# Patient Record
Sex: Male | Born: 1950 | Race: White | Hispanic: No | Marital: Single | Smoking: Never smoker
Health system: Southern US, Community
[De-identification: ages and names within clinical notes are randomized; demographics above are authoritative.]

## PROBLEM LIST (undated history)

## (undated) DIAGNOSIS — M1990 Unspecified osteoarthritis, unspecified site: Secondary | ICD-10-CM

## (undated) DIAGNOSIS — N200 Calculus of kidney: Secondary | ICD-10-CM

## (undated) DIAGNOSIS — E7800 Pure hypercholesterolemia, unspecified: Secondary | ICD-10-CM

## (undated) DIAGNOSIS — E119 Type 2 diabetes mellitus without complications: Secondary | ICD-10-CM

## (undated) DIAGNOSIS — I10 Essential (primary) hypertension: Secondary | ICD-10-CM

## (undated) DIAGNOSIS — K219 Gastro-esophageal reflux disease without esophagitis: Secondary | ICD-10-CM

## (undated) HISTORY — DX: Essential (primary) hypertension: I10

## (undated) HISTORY — DX: Type 2 diabetes mellitus without complications: E11.9

## (undated) HISTORY — PX: ESOPHAGOGASTRODUODENOSCOPY: SHX1529

## (undated) HISTORY — DX: Unspecified osteoarthritis, unspecified site: M19.90

## (undated) HISTORY — DX: Pure hypercholesterolemia, unspecified: E78.00

---

## 2009-01-24 HISTORY — PX: KIDNEY STONE SURGERY: SHX686

## 2010-04-14 ENCOUNTER — Emergency Department (HOSPITAL_COMMUNITY)
Admission: EM | Admit: 2010-04-14 | Discharge: 2010-04-14 | Disposition: A | Discharge status: Home / Self Care | Payer: Worker's Compensation | Attending: Emergency Medicine | Admitting: Emergency Medicine

## 2010-04-14 ENCOUNTER — Emergency Department (HOSPITAL_COMMUNITY): Payer: Worker's Compensation

## 2010-04-14 DIAGNOSIS — W240XXA Contact with lifting devices, not elsewhere classified, initial encounter: Secondary | ICD-10-CM | POA: Insufficient documentation

## 2010-04-14 DIAGNOSIS — Y9269 Other specified industrial and construction area as the place of occurrence of the external cause: Secondary | ICD-10-CM | POA: Insufficient documentation

## 2010-04-14 DIAGNOSIS — S9030XA Contusion of unspecified foot, initial encounter: Secondary | ICD-10-CM | POA: Insufficient documentation

## 2011-10-14 IMAGING — CR DG FOOT COMPLETE 3+V*R*
3 series · 3 of 3 positions shown · non-contrast
Comparison: None.

CLINICAL DATA: Trauma.  Pain and bruising over dorsum of foot.

RIGHT FOOT COMPLETE - 3+ VIEW

[view not recorded (1 of 3)]
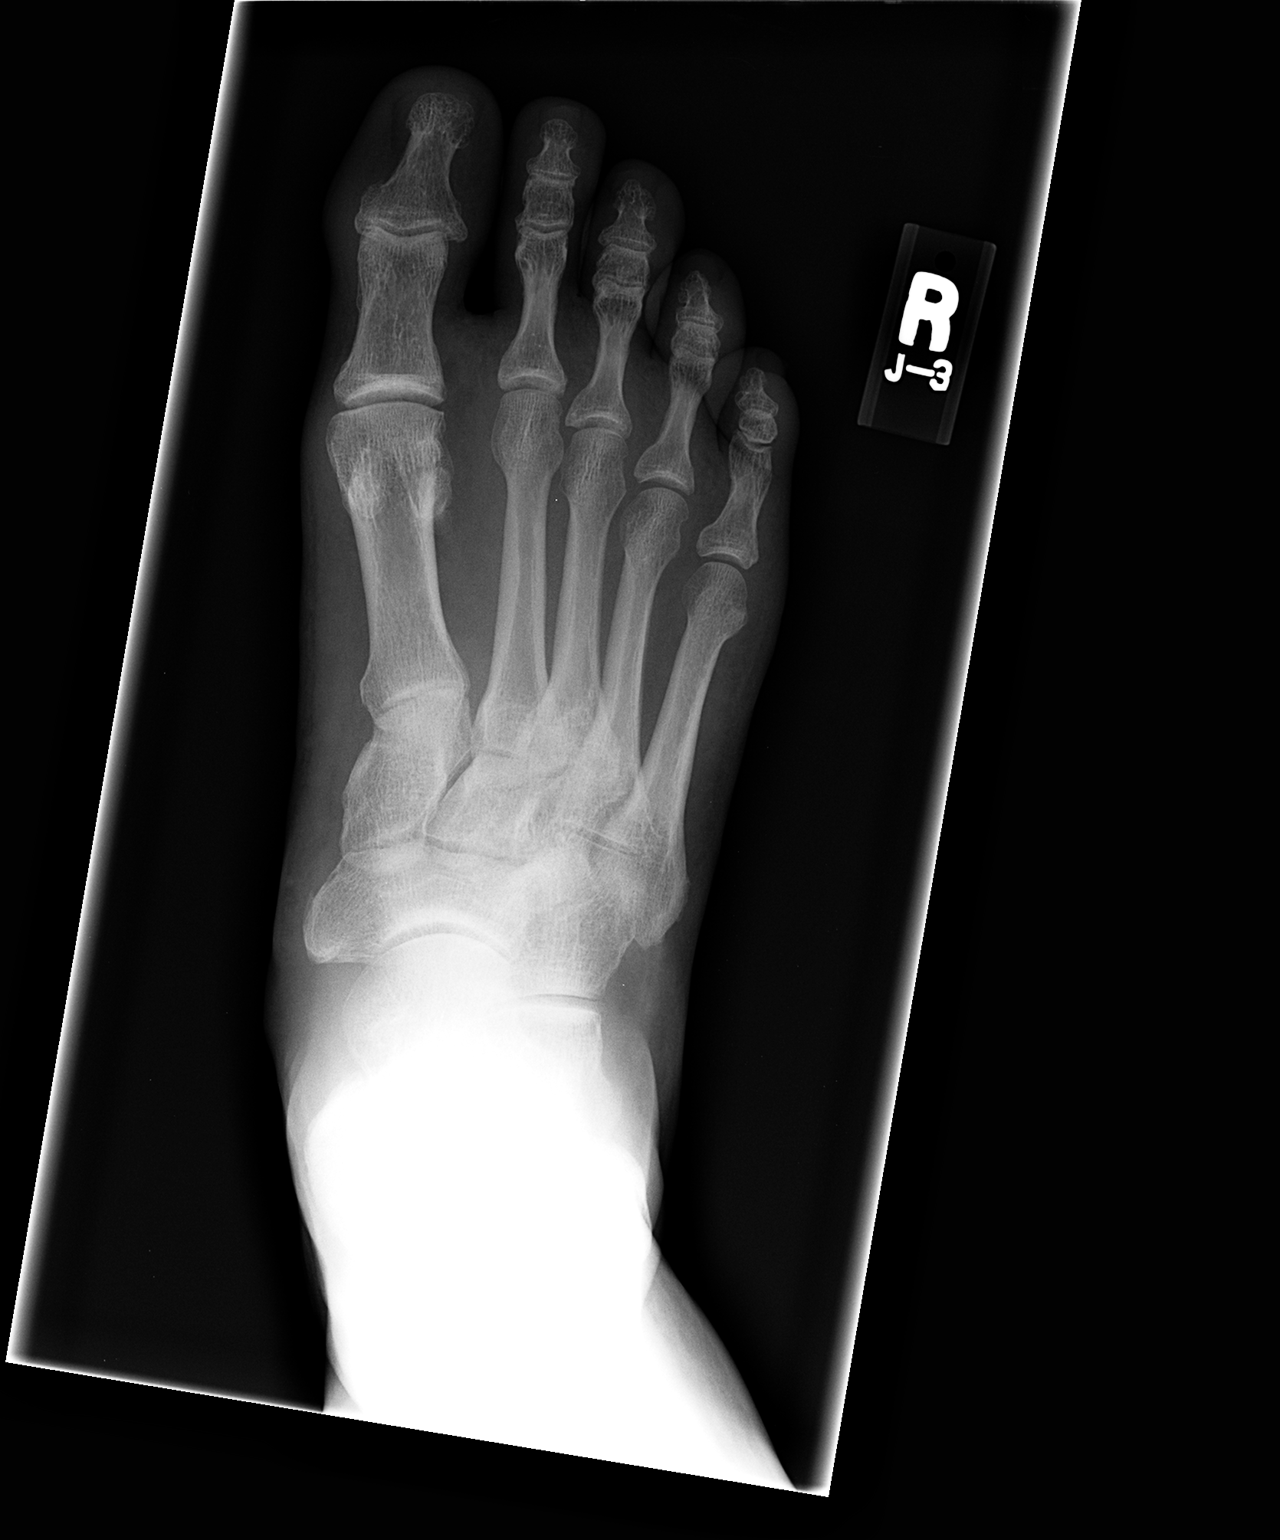

[view not recorded (2 of 3)]
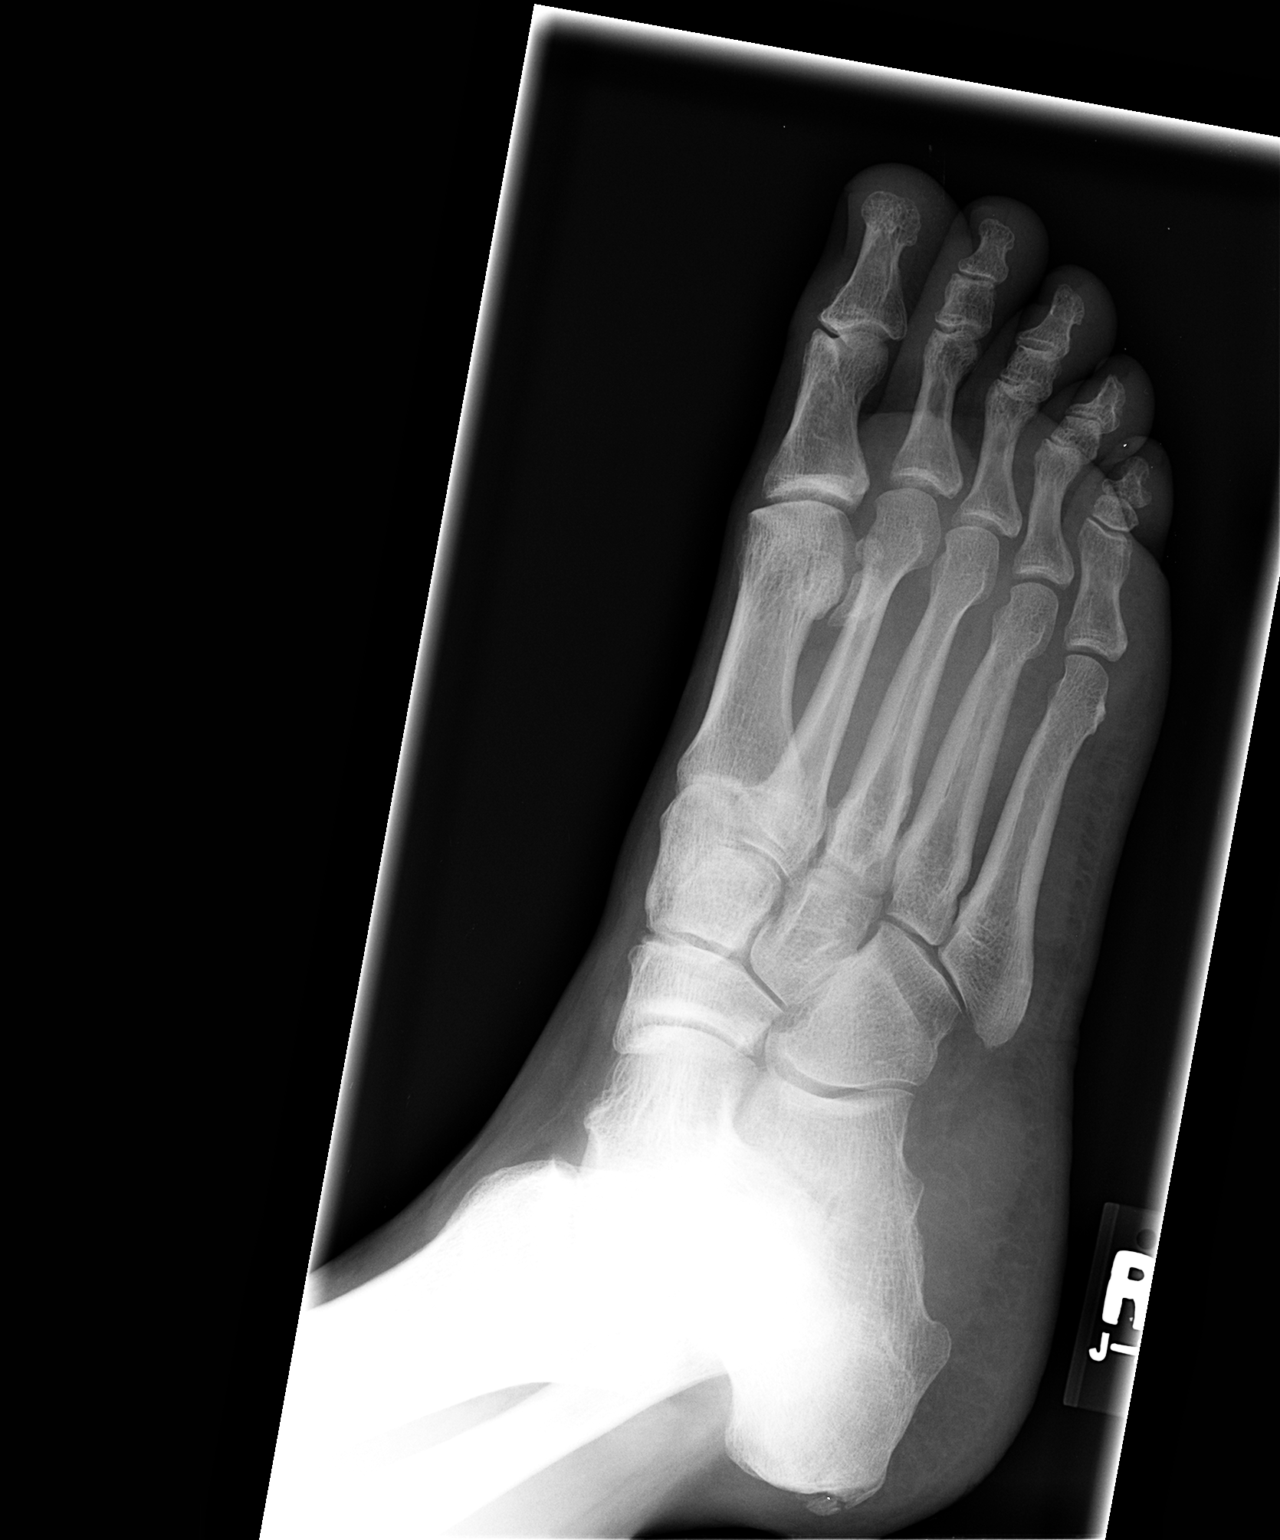

[view not recorded (3 of 3)]
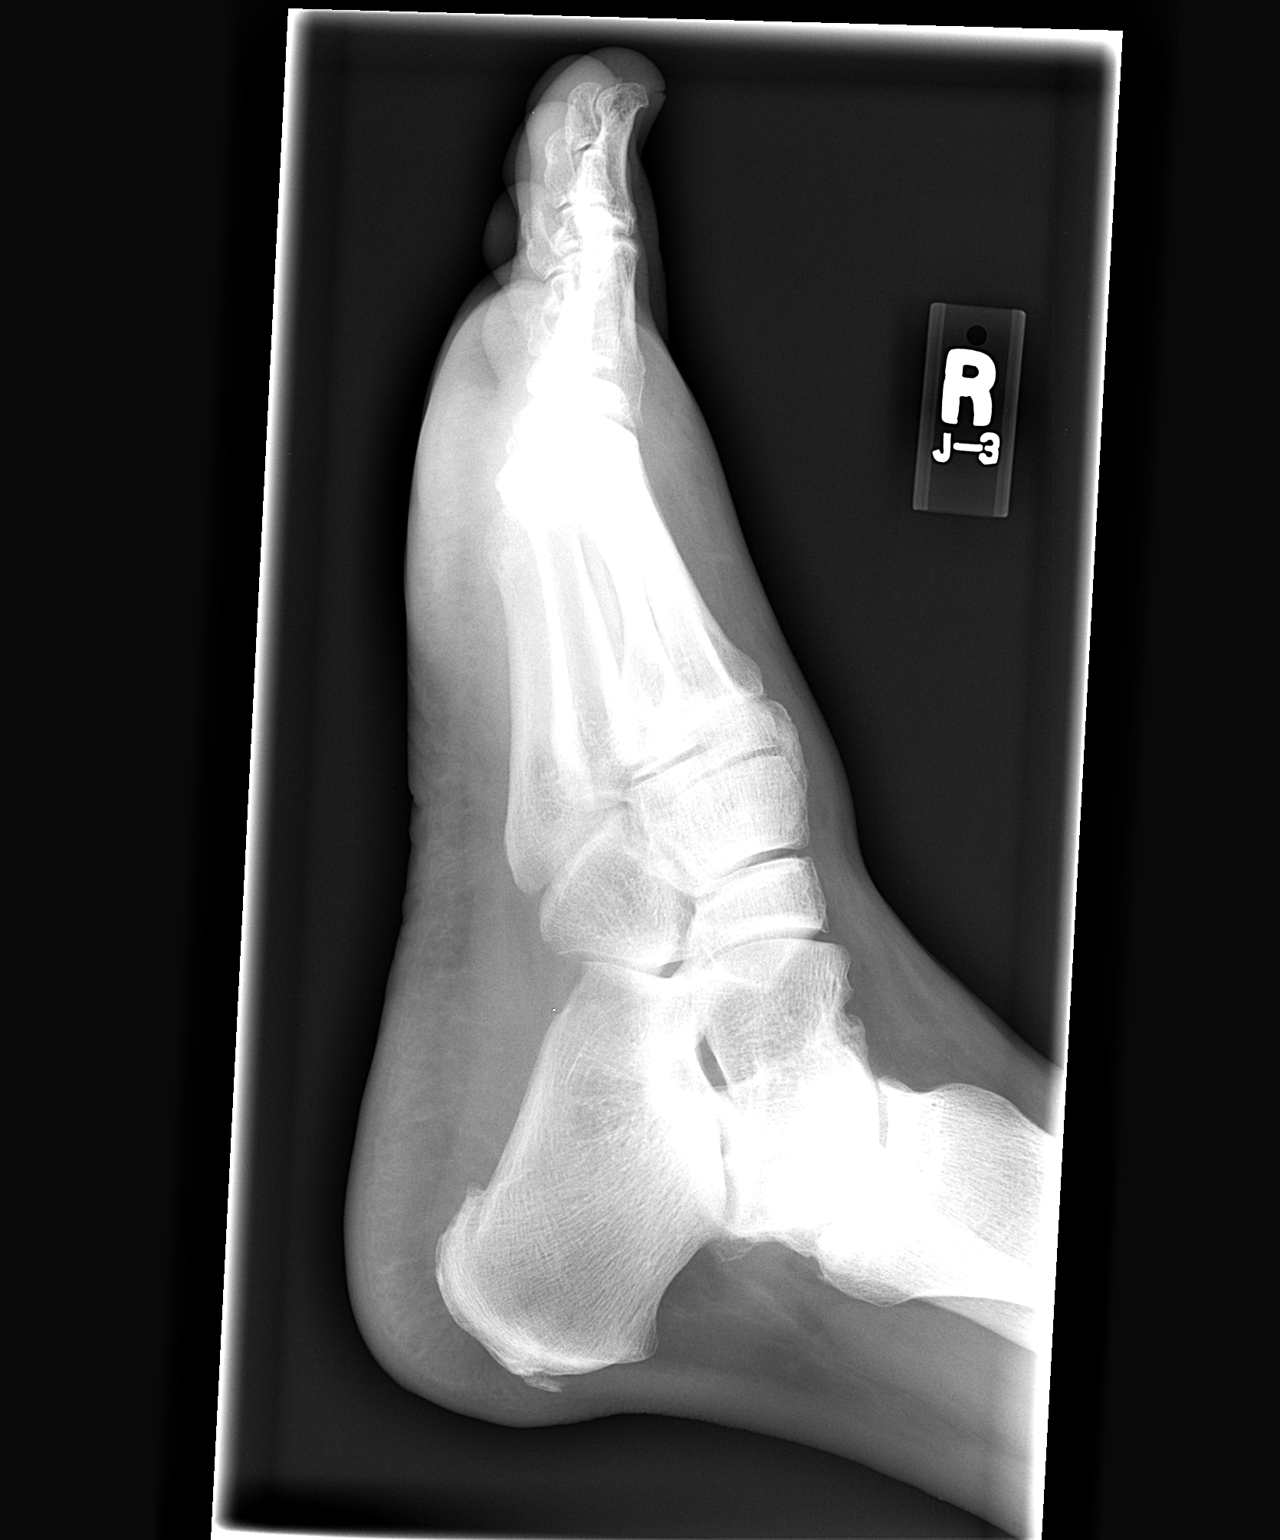

[3 of 3 positions shown; findings below may reference images not displayed]

FINDINGS: Dorsal soft tissue swelling about the forefoot.  Small
Achilles and calcaneal spurs. No acute fracture or dislocation.
IMPRESSION: Dorsal soft tissue swelling without acute osseous abnormality.

## 2013-02-13 ENCOUNTER — Encounter (INDEPENDENT_AMBULATORY_CARE_PROVIDER_SITE_OTHER): Payer: Self-pay

## 2013-02-13 ENCOUNTER — Ambulatory Visit (INDEPENDENT_AMBULATORY_CARE_PROVIDER_SITE_OTHER): Payer: No Typology Code available for payment source | Admitting: Gastroenterology

## 2013-02-13 ENCOUNTER — Encounter: Payer: Self-pay | Admitting: Gastroenterology

## 2013-02-13 VITALS — BP 119/77 | HR 60 | Temp 97.8°F | Ht 68.0 in | Wt 195.6 lb

## 2013-02-13 DIAGNOSIS — Z1211 Encounter for screening for malignant neoplasm of colon: Secondary | ICD-10-CM

## 2013-02-13 DIAGNOSIS — R1319 Other dysphagia: Secondary | ICD-10-CM

## 2013-02-13 MED ORDER — PEG 3350-KCL-NA BICARB-NACL 420 G PO SOLR: 4000.0000 mL | ORAL | Status: DC

## 2013-02-13 NOTE — Progress Notes (Signed)
Primary Care Physician:  Monico Blitz, MD Primary Gastroenterologist:  Dr. Gala Romney   Chief Complaint  Patient presents with  . Dysphagia    HPI:   Daryl Bell presents today at the request of Dr. Manuella Ghazi secondary to dysphagia. Last EGD in the remote past per patient by Dr. Gala Romney in the 1990s. States recurrent dysphagia, noting difficulty with pork chops, chicken, large pills. Feels like symptoms are worsening over time. Present for several years now. No abdominal pain. Rare constipation, doesn't feel like he needs any additional medications to treat it. No rectal bleeding. Weight loss of 40 lbs since starting Metformin. Rare indigestion; only has with beer. Takes Zantac as needed.   No prior colonoscopy.   Past Medical History  Diagnosis Date  . Diabetes   . Hypertension   . Hypercholesterolemia   . Arthritis     Past Surgical History  Procedure Laterality Date  . Esophagogastroduodenoscopy      remote past in the 1990s with dilation  . Kidney stone surgery  2011    Current Outpatient Prescriptions  Medication Sig Dispense Refill  . atenolol-chlorthalidone (TENORETIC) 100-25 MG per tablet Take 1 tablet by mouth daily.       . Cyanocobalamin (VITAMIN B-12) 500 MCG SUBL Place under the tongue daily.      Marland Kitchen loratadine (CLARITIN) 10 MG tablet Take 10 mg by mouth daily.      Marland Kitchen lovastatin (MEVACOR) 20 MG tablet Take 20 mg by mouth 2 (two) times daily.       . Magnesium 250 MG TABS Take 250 mg by mouth daily.      . metFORMIN (GLUCOPHAGE) 1000 MG tablet Take 1,000 mg by mouth 2 (two) times daily with a meal.       . potassium chloride (K-DUR) 10 MEQ tablet Take 10 mEq by mouth once.       . Potassium Gluconate 595 MG TBCR Take 595 mg by mouth daily.      . ranitidine (ZANTAC) 150 MG tablet Take 150 mg by mouth as needed for heartburn.      . traMADol (ULTRAM) 50 MG tablet Take 50 mg by mouth 2 (two) times daily.        No current facility-administered medications for this  visit.    Allergies as of 02/13/2013  . (No Known Allergies)    Family History  Problem Relation Age of Onset  . Colon cancer Neg Hx     History   Social History  . Marital Status: Single    Spouse Name: N/A    Number of Children: N/A  . Years of Education: N/A   Occupational History  . Not on file.   Social History Main Topics  . Smoking status: Never Smoker   . Smokeless tobacco: Not on file  . Alcohol Use: Yes     Comment: very very little  . Drug Use: No  . Sexual Activity: Not on file   Other Topics Concern  . Not on file   Social History Narrative  . No narrative on file    Review of Systems: Gen: Denies any fever, chills, fatigue, weight loss, lack of appetite.  CV: Denies chest pain, heart palpitations, peripheral edema, syncope.  Resp: Denies shortness of breath at rest or with exertion. Denies wheezing or cough.  GI: see HPI GU : Denies urinary burning, urinary frequency, urinary hesitancy MS: cane for walking as needed, forklift ran over right foot  Derm: Denies rash, itching,  dry skin Psych: Denies depression, anxiety, memory loss, and confusion Heme: Denies bruising, bleeding, and enlarged lymph nodes.  Physical Exam: BP 119/77  Pulse 60  Temp(Src) 97.8 F (36.6 C) (Oral)  Ht 5' 8" (1.727 m)  Wt 195 lb 9.6 oz (88.724 kg)  BMI 29.75 kg/m2 General:   Alert and oriented. Pleasant and cooperative. Well-nourished and well-developed.  Head:  Normocephalic and atraumatic. Eyes:  Without icterus, sclera clear and conjunctiva pink.  Ears:  Normal auditory acuity. Nose:  No deformity, discharge,  or lesions. Mouth:  No deformity or lesions, oral mucosa pink.  Neck:  Supple, without mass or thyromegaly. Lungs:  Clear to auscultation bilaterally. No wheezes, rales, or rhonchi. No distress.  Heart:  S1, S2 present without murmurs appreciated.  Abdomen:  +BS, soft, non-tender and non-distended. No HSM noted. No guarding or rebound. No masses  appreciated.  Rectal:  Deferred  Msk:  Symmetrical without gross deformities. Normal posture. Extremities:  Without clubbing or edema. Neurologic:  Alert and  oriented x4;  grossly normal neurologically. Skin:  Intact without significant lesions or rashes. Cervical Nodes:  No significant cervical adenopathy. Psych:  Alert and cooperative. Normal mood and affect.    

## 2013-02-13 NOTE — Assessment & Plan Note (Signed)
63 year old male with history of dysphagia in the remote past requiring dilation, now presenting with recurrent dysphagia to solids and pills. No other signs such as early satiety, odynophagia, melena. Reflux rare. Unable to retrieve past op notes as this was in the 1990s, but patient does report dilation by Dr. Gala Romney at that time.  Needs endoscopy in the near future with Dr. Gala Romney. The risks, benefits, and alternatives have been discussed in detail with patient. They have stated understanding and desire to proceed.  Phenergan 25 mg IV on call to help facilitate sedation

## 2013-02-13 NOTE — Assessment & Plan Note (Signed)
No prior lower GI evaluation. No concerning lower GI symptoms or family history of colon cancer.   Proceed with TCS at time of EGD with Dr. Gala Romney. The risks, benefits, and alternatives have been discussed with the patient in detail. The patient states understanding and desires to proceed.

## 2013-02-13 NOTE — Patient Instructions (Signed)
We have set you up for a colonoscopy, upper endoscopy and dilation with Dr. Gala Romney in near future.  Further recommendations to follow.

## 2013-02-14 NOTE — Progress Notes (Signed)
cc'd to pcp 

## 2013-03-07 ENCOUNTER — Ambulatory Visit (HOSPITAL_COMMUNITY)
Admission: RE | Admit: 2013-03-07 | Discharge: 2013-03-07 | Disposition: A | Discharge status: Home / Self Care | Payer: Medicare Other | Source: Ambulatory Visit | Attending: Internal Medicine | Admitting: Internal Medicine

## 2013-03-07 ENCOUNTER — Encounter (HOSPITAL_COMMUNITY)
Admission: RE | Disposition: A | Discharge status: Home / Self Care | Payer: Self-pay | Source: Ambulatory Visit | Attending: Internal Medicine

## 2013-03-07 ENCOUNTER — Encounter (HOSPITAL_COMMUNITY): Payer: Self-pay | Admitting: *Deleted

## 2013-03-07 DIAGNOSIS — K2100 Gastro-esophageal reflux disease with esophagitis, without bleeding: Secondary | ICD-10-CM | POA: Insufficient documentation

## 2013-03-07 DIAGNOSIS — R1310 Dysphagia, unspecified: Secondary | ICD-10-CM | POA: Insufficient documentation

## 2013-03-07 DIAGNOSIS — K222 Esophageal obstruction: Secondary | ICD-10-CM

## 2013-03-07 DIAGNOSIS — I10 Essential (primary) hypertension: Secondary | ICD-10-CM | POA: Insufficient documentation

## 2013-03-07 DIAGNOSIS — D126 Benign neoplasm of colon, unspecified: Secondary | ICD-10-CM

## 2013-03-07 DIAGNOSIS — E119 Type 2 diabetes mellitus without complications: Secondary | ICD-10-CM | POA: Insufficient documentation

## 2013-03-07 DIAGNOSIS — R1319 Other dysphagia: Secondary | ICD-10-CM

## 2013-03-07 DIAGNOSIS — Z1211 Encounter for screening for malignant neoplasm of colon: Secondary | ICD-10-CM

## 2013-03-07 DIAGNOSIS — K2940 Chronic atrophic gastritis without bleeding: Secondary | ICD-10-CM | POA: Insufficient documentation

## 2013-03-07 DIAGNOSIS — E7800 Pure hypercholesterolemia, unspecified: Secondary | ICD-10-CM | POA: Insufficient documentation

## 2013-03-07 HISTORY — PX: COLONOSCOPY, ESOPHAGOGASTRODUODENOSCOPY (EGD) AND ESOPHAGEAL DILATION: SHX5781

## 2013-03-07 HISTORY — DX: Calculus of kidney: N20.0

## 2013-03-07 HISTORY — DX: Gastro-esophageal reflux disease without esophagitis: K21.9

## 2013-03-07 LAB — GLUCOSE, CAPILLARY: Glucose-Capillary: 118 mg/dL — ABNORMAL HIGH (ref 70–99)

## 2013-03-07 SURGERY — COLONOSCOPY, ESOPHAGOGASTRODUODENOSCOPY (EGD) AND ESOPHAGEAL DILATION (ED)
Anesthesia: Moderate Sedation

## 2013-03-07 MED ORDER — PROMETHAZINE HCL 25 MG/ML IJ SOLN
25.0000 mg | Freq: Once | INTRAMUSCULAR | Status: AC
  Administered 2013-03-07: 25 mg via INTRAVENOUS

## 2013-03-07 MED ORDER — MEPERIDINE HCL 100 MG/ML IJ SOLN
INTRAMUSCULAR | Status: AC
  Filled 2013-03-07: qty 2

## 2013-03-07 MED ORDER — MIDAZOLAM HCL 5 MG/5ML IJ SOLN
INTRAMUSCULAR | Status: AC
  Filled 2013-03-07: qty 10

## 2013-03-07 MED ORDER — ONDANSETRON HCL 4 MG/2ML IJ SOLN
INTRAMUSCULAR | Status: AC
  Filled 2013-03-07: qty 2

## 2013-03-07 MED ORDER — PROMETHAZINE HCL 25 MG/ML IJ SOLN
INTRAMUSCULAR | Status: AC
  Filled 2013-03-07: qty 1

## 2013-03-07 MED ORDER — LIDOCAINE VISCOUS 2 % MT SOLN
OROMUCOSAL | Status: DC | PRN
  Administered 2013-03-07: 3 mL via OROMUCOSAL

## 2013-03-07 MED ORDER — MIDAZOLAM HCL 5 MG/5ML IJ SOLN
INTRAMUSCULAR | Status: DC | PRN
  Administered 2013-03-07 (×2): 2 mg via INTRAVENOUS
  Administered 2013-03-07 (×3): 1 mg via INTRAVENOUS

## 2013-03-07 MED ORDER — LIDOCAINE VISCOUS 2 % MT SOLN
OROMUCOSAL | Status: AC
  Filled 2013-03-07: qty 15

## 2013-03-07 MED ORDER — SIMETHICONE 40 MG/0.6ML PO SUSP
ORAL | Status: DC | PRN
  Administered 2013-03-07: 11:00:00

## 2013-03-07 MED ORDER — ONDANSETRON HCL 4 MG/2ML IJ SOLN
INTRAMUSCULAR | Status: DC | PRN
  Administered 2013-03-07: 4 mg via INTRAVENOUS

## 2013-03-07 MED ORDER — SODIUM CHLORIDE 0.9 % IV SOLN
INTRAVENOUS | Status: DC
  Administered 2013-03-07: 1000 mL via INTRAVENOUS

## 2013-03-07 MED ORDER — MEPERIDINE HCL 100 MG/ML IJ SOLN
INTRAMUSCULAR | Status: DC | PRN
  Administered 2013-03-07 (×2): 50 mg via INTRAVENOUS

## 2013-03-07 MED ORDER — SODIUM CHLORIDE 0.9 % IJ SOLN
INTRAMUSCULAR | Status: AC
  Filled 2013-03-07: qty 10

## 2013-03-07 NOTE — Interval H&P Note (Signed)
History and Physical Interval Note:  03/07/2013 10:59 AM  Daryl Bell  has presented today for surgery, with the diagnosis of SCREENING COLONOSCOPY AND DYSPHAGIA  The various methods of treatment have been discussed with the patient and family. After consideration of risks, benefits and other options for treatment, the patient has consented to  Procedure(s) with comments: COLONOSCOPY, ESOPHAGOGASTRODUODENOSCOPY (EGD) AND ESOPHAGEAL DILATION (N/A) - 11:15 as a surgical intervention .  The patient's history has been reviewed, patient examined, no change in status, stable for surgery.  I have reviewed the patient's chart and labs.  Questions were answered to the patient's satisfaction.     Daryl Bell  No change. EGD with probable esophageal dilation and colonoscopy per plan.The risks, benefits, limitations, imponderables and alternatives regarding both EGD and colonoscopy have been reviewed with the patient. Questions have been answered. All parties agreeable.

## 2013-03-07 NOTE — Discharge Instructions (Addendum)
Colonoscopy Discharge Instructions  Read the instructions outlined below and refer to this sheet in the next few weeks. These discharge instructions provide you with general information on caring for yourself after you leave the hospital. Your doctor may also give you specific instructions. While your treatment has been planned according to the most current medical practices available, unavoidable complications occasionally occur. If you have any problems or questions after discharge, call Dr. Gala Romney at [phone]. ACTIVITY  You may resume your regular activity, but move at a slower pace for the next 24 hours.   Take frequent rest periods for the next 24 hours.   Walking will help get rid of the air and reduce the bloated feeling in your belly (abdomen).   No driving for 24 hours (because of the medicine (anesthesia) used during the test).    Do not sign any important legal documents or operate any machinery for 24 hours (because of the anesthesia used during the test).  NUTRITION  Drink plenty of fluids.   You may resume your normal diet as instructed by your doctor.   Begin with a light meal and progress to your normal diet. Heavy or fried foods are harder to digest and may make you feel sick to your stomach (nauseated).   Avoid alcoholic beverages for 24 hours or as instructed.  MEDICATIONS  You may resume your normal medications unless your doctor tells you otherwise.  WHAT YOU CAN EXPECT TODAY  Some feelings of bloating in the abdomen.   Passage of more gas than usual.   Spotting of blood in your stool or on the toilet paper.  IF YOU HAD POLYPS REMOVED DURING THE COLONOSCOPY:  No aspirin products for 7 days or as instructed.   No alcohol for 7 days or as instructed.   Eat a soft diet for the next 24 hours.  FINDING OUT THE RESULTS OF YOUR TEST Not all test results are available during your visit. If your test results are not back during the visit, make an appointment  with your caregiver to find out the results. Do not assume everything is normal if you have not heard from your caregiver or the medical facility. It is important for you to follow up on all of your test results.  SEEK IMMEDIATE MEDICAL ATTENTION IF:  You have more than a spotting of blood in your stool.   Your belly is swollen (abdominal distention).   You are nauseated or vomiting.   You have a temperature over 101.   You have abdominal pain or discomfort that is severe or gets worse throughout the day.   EGD Discharge instructions Please read the instructions outlined below and refer to this sheet in the next few weeks. These discharge instructions provide you with general information on caring for yourself after you leave the hospital. Your doctor may also give you specific instructions. While your treatment has been planned according to the most current medical practices available, unavoidable complications occasionally occur. If you have any problems or questions after discharge, please call your doctor. ACTIVITY  You may resume your regular activity but move at a slower pace for the next 24 hours.   Take frequent rest periods for the next 24 hours.   Walking will help expel (get rid of) the air and reduce the bloated feeling in your abdomen.   No driving for 24 hours (because of the anesthesia (medicine) used during the test).   You may shower.   Do not sign any  important legal documents or operate any machinery for 24 hours (because of the anesthesia used during the test).  NUTRITION  Drink plenty of fluids.   You may resume your normal diet.   Begin with a light meal and progress to your normal diet.   Avoid alcoholic beverages for 24 hours or as instructed by your caregiver.  MEDICATIONS  You may resume your normal medications unless your caregiver tells you otherwise.  WHAT YOU CAN EXPECT TODAY  You may experience abdominal discomfort such as a feeling of  fullness or gas pains.  FOLLOW-UP  Your doctor will discuss the results of your test with you.  SEEK IMMEDIATE MEDICAL ATTENTION IF ANY OF THE FOLLOWING OCCUR:  Excessive nausea (feeling sick to your stomach) and/or vomiting.   Severe abdominal pain and distention (swelling).   Trouble swallowing.   Temperature over 101 F (37.8 C).   Rectal bleeding or vomiting of blood.     Stop ranitidine; begin Protonix 40 mg daily  GERD information provided  Polyp information provided  Further recommendations to follow pending review of pathology report   Colon Polyps Polyps are lumps of extra tissue growing inside the body. Polyps can grow in the large intestine (colon). Most colon polyps are noncancerous (benign). However, some colon polyps can become cancerous over time. Polyps that are larger than a pea may be harmful. To be safe, caregivers remove and test all polyps. CAUSES  Polyps form when mutations in the genes cause your cells to grow and divide even though no more tissue is needed. RISK FACTORS There are a number of risk factors that can increase your chances of getting colon polyps. They include:  Being older than 50 years.  Family history of colon polyps or colon cancer.  Long-term colon diseases, such as colitis or Crohn disease.  Being overweight.  Smoking.  Being inactive.  Drinking too much alcohol. SYMPTOMS  Most small polyps do not cause symptoms. If symptoms are present, they may include:  Blood in the stool. The stool may look dark red or black.  Constipation or diarrhea that lasts longer than 1 week. DIAGNOSIS People often do not know they have polyps until their caregiver finds them during a regular checkup. Your caregiver can use 4 tests to check for polyps:  Digital rectal exam. The caregiver wears gloves and feels inside the rectum. This test would find polyps only in the rectum.  Barium enema. The caregiver puts a liquid called barium into  your rectum before taking X-rays of your colon. Barium makes your colon look white. Polyps are dark, so they are easy to see in the X-ray pictures.  Sigmoidoscopy. A thin, flexible tube (sigmoidoscope) is placed into your rectum. The sigmoidoscope has a light and tiny camera in it. The caregiver uses the sigmoidoscope to look at the last third of your colon.  Colonoscopy. This test is like sigmoidoscopy, but the caregiver looks at the entire colon. This is the most common method for finding and removing polyps. TREATMENT  Any polyps will be removed during a sigmoidoscopy or colonoscopy. The polyps are then tested for cancer. PREVENTION  To help lower your risk of getting more colon polyps:  Eat plenty of fruits and vegetables. Avoid eating fatty foods.  Do not smoke.  Avoid drinking alcohol.  Exercise every day.  Lose weight if recommended by your caregiver.  Eat plenty of calcium and folate. Foods that are rich in calcium include milk, cheese, and broccoli. Foods that are rich  in folate include chickpeas, kidney beans, and spinach. HOME CARE INSTRUCTIONS Keep all follow-up appointments as directed by your caregiver. You may need periodic exams to check for polyps. SEEK MEDICAL CARE IF: You notice bleeding during a bowel movement. Document Released: 10/07/2003 Document Revised: 04/04/2011 Document Reviewed: 03/22/2011 [facility] Patient Information 2014 [place]. Gastroesophageal Reflux Disease, Adult Gastroesophageal reflux disease (GERD) happens when acid from your stomach goes into your food pipe (esophagus). The acid can cause a burning feeling in your chest. Over time, the acid can make small holes (ulcers) in your food pipe.  HOME CARE  Ask your doctor for advice about:  Losing weight.  Quitting smoking.  Alcohol use.  Avoid foods and drinks that make your problems worse. You may want to avoid:  Caffeine and alcohol.  Chocolate.  Mints.  Garlic and  onions.  Spicy foods.  Citrus fruits, such as oranges, lemons, or limes.  Foods that contain tomato, such as sauce, chili, salsa, and pizza.  Fried and fatty foods.  Avoid lying down for 3 hours before you go to bed or before you take a nap.  Eat small meals often, instead of large meals.  Wear loose-fitting clothing. Do not wear anything tight around your waist.  Raise (elevate) the head of your bed 6 to 8 inches with wood blocks. Using extra pillows does not help.  Only take medicines as told by your doctor.  Do not take aspirin or ibuprofen. GET HELP RIGHT AWAY IF:   You have pain in your arms, neck, jaw, teeth, or back.  Your pain gets worse or changes.  You feel sick to your stomach (nauseous), throw up (vomit), or sweat (diaphoresis).  You feel short of breath, or you pass out (faint).  Your throw up is green, yellow, black, or looks like coffee grounds or blood.  Your poop (stool) is red, bloody, or black. MAKE SURE YOU:   Understand these instructions.  Will watch your condition.  Will get help right away if you are not doing well or get worse. Document Released: 06/29/2007 Document Revised: 04/04/2011 Document Reviewed: 07/30/2010 [facility] Patient Information 2014 [place], [place]. Diet for Gastroesophageal Reflux Disease, Adult Reflux is when stomach acid flows up into the esophagus. The esophagus becomes irritated and sore (inflammation). When reflux happens often and is severe, it is called gastroesophageal reflux disease (GERD). What you eat can help ease any discomfort caused by GERD. FOODS OR DRINKS TO AVOID OR LIMIT  Coffee and black tea, with or without caffeine.  Bubbly (carbonated) drinks with caffeine or energy drinks.  Strong spices, such as pepper, cayenne pepper, curry, or chili powder.  Peppermint or spearmint.  Chocolate.  High-fat foods, such as meats, fried food, oils, butter, or nuts.  Fruits and vegetables that cause discomfort.  This includes citrus fruits and tomatoes.  Alcohol. If a certain food or drink irritates your GERD, avoid eating or drinking it. THINGS THAT MAY HELP GERD INCLUDE:  Eat meals slowly.  Eat 5 to 6 small meals a day, not 3 large meals.  Do not eat food for a certain amount of time if it causes discomfort.  Wait 3 hours after eating before lying down.  Keep the head of your bed raised 6 to 9 inches (15 23 centimeters). Put a foam wedge or blocks under the legs of the bed.  Stay active. Weight loss, if needed, may help ease your discomfort.  Wear loose-fitting clothing.  Do not smoke or chew tobacco. Document Released: 07/12/2011  Document Reviewed: 07/12/2011 [facility] Patient Information 2014 [place].

## 2013-03-07 NOTE — Op Note (Signed)
[facility] [address] [place], [address]   COLONOSCOPY PROCEDURE REPORT  PATIENT: [name], [name]  MR#:         [id number] BIRTHDATE: 14-Feb-1950 , 74  yrs. old GENDER: Male ENDOSCOPIST: R.  Garfield Cornea, MD FACP FACG REFERRED BY:  Monico Blitz, M.D. PROCEDURE DATE:  03/07/2013 PROCEDURE:     Ileocolonoscopy with multiple snare polypectomies  INDICATIONS: First-ever average risk colorectal future screening examination  INFORMED CONSENT:  The risks, benefits, alternatives and imponderables including but not limited to bleeding, perforation as well as the possibility of a missed lesion have been reviewed.  The potential for biopsy, lesion removal, etc. have also been discussed.  Questions have been answered.  All parties agreeable. Please see the history and physical in the medical record for more information.  MEDICATIONS: Versed 7 mg 100 and Demerol 100 milligrams IV and Zofran 4 mg IV.  Phenergan 25 mg IV   DESCRIPTION OF PROCEDURE:  After a digital rectal exam was performed, the EC-[id number] ([id number])  colonoscope was advanced from the anus through the rectum and colon to the area of the cecum, ileocecal valve and appendiceal orifice.  The cecum was deeply intubated.  These structures were well-seen and photographed for the record.  From the level of the cecum and ileocecal valve, the scope was slowly and cautiously withdrawn.  The mucosal surfaces were carefully surveyed utilizing scope tip deflection to facilitate fold flattening as needed.  The scope was pulled down into the rectum where a thorough examination including retroflexion was performed.    FINDINGS:  Adequate preparation. Normal rectum. (3) pedunculated polyps at the hepatic flexure-the largest approximately 9 mm in dimensions. Otherwise, the remainder of the colonic mucosa appeared normal. The distal 5 cm of terminal ileal mucosa appeared normal.  THERAPEUTIC / DIAGNOSTIC MANEUVERS  PERFORMED:  The above-mentioned polyps were hot snare removed.  COMPLICATIONS: none CECAL WITHDRAWAL TIME:  11 minutes  IMPRESSION:  Multiple colonic polyps-removed as described above  RECOMMENDATIONS: Followup on pathology. See EGD report.   _______________________________ eSigned:  R. Garfield Cornea, MD FACP [facility] 03/07/2013 11:56 AM   CC:    PATIENT NAME:  Daryl Bell, Daryl Bell MR#: [id number]

## 2013-03-07 NOTE — Op Note (Signed)
[facility] [address] [place], [address]   ENDOSCOPY PROCEDURE REPORT  PATIENT: Daryl, Bell  MR#: [id number] BIRTHDATE: 02/14/1950 , 51  yrs. old GENDER: Male ENDOSCOPIST: R.  Garfield Cornea, MD FACP FACG REFERRED BY:  Monico Blitz, M.D. PROCEDURE DATE:  03/07/2013 PROCEDURE:     EGD with Venia Minks dilation followed by duodenal, gastric and esophageal biopsy  INDICATIONS:     Recurrent esophageal dysphagia  INFORMED CONSENT:   The risks, benefits, limitations, alternatives and imponderables have been discussed.  The potential for biopsy, esophogeal dilation, etc. have also been reviewed.  Questions have been answered.  All parties agreeable.  Please see the history and physical in the medical record for more information.  MEDICATIONS:      Versed 5 mg IV and Demerol 100 mg IV in divided doses; Phenergan 25 mg and Zofran 4 mg. Xylocaine gel 2% topically.  DESCRIPTION OF PROCEDURE:   The EG-[id number] ([id number])  endoscope was introduced through the mouth and advanced to the second portion of the duodenum without difficulty or limitations.  The mucosal surfaces were surveyed very carefully during advancement of the scope and upon withdrawal.  Retroflexion view of the proximal stomach and esophagogastric junction was performed.      FINDINGS: Schatzki's ring present with overlying erosions. No evidence of Barrett's esophagus. Stomach empty. Small hiatal hernia. Antral erosions and patchy erythema. No ulcer or infiltrating process. Patent pylorus. Examination of the bulb second and third portion multiple erosions involving the second third portion of the duodenum.  THERAPEUTIC / DIAGNOSTIC MANEUVERS PERFORMED:  A 56 French Maloney dilator was passed with full insertion with mild to moderate resistance. A look back revealed a Schatzki's ring it did rupture nicely there were multiple superficial tears along the esophageal wall. These were all superficial  with minimal bleeding. Subsequently, biopsies duodenum, stomach and esophagus were taken for histologic study   COMPLICATIONS:  None  IMPRESSION:  Erosive reflux esophagitis. Schatzki's ring  -  status post dilation. Status post esophageal biopsy. Gastric and duodenal erosions status post biopsy  RECOMMENDATIONS:  Stop ranitidine; begin Protonix  40 mg daily. Followup on pathology. See colonoscopy report.    _______________________________ R. Garfield Cornea, MD FACP [facility] eSigned:  R. Garfield Cornea, MD FACP [facility] 03/07/2013 11:31 AM     CC:  PATIENT NAME:  Daryl, Bell MR#: [id number]

## 2013-03-07 NOTE — H&P (View-Only) (Signed)
Primary Care Physician:  Monico Blitz, MD Primary Gastroenterologist:  Dr. Gala Romney   Chief Complaint  Patient presents with  . Dysphagia    HPI:   Daryl Bell presents today at the request of Dr. Manuella Ghazi secondary to dysphagia. Last EGD in the remote past per patient by Dr. Gala Romney in the 1990s. States recurrent dysphagia, noting difficulty with pork chops, chicken, large pills. Feels like symptoms are worsening over time. Present for several years now. No abdominal pain. Rare constipation, doesn't feel like he needs any additional medications to treat it. No rectal bleeding. Weight loss of 40 lbs since starting Metformin. Rare indigestion; only has with beer. Takes Zantac as needed.   No prior colonoscopy.   Past Medical History  Diagnosis Date  . Diabetes   . Hypertension   . Hypercholesterolemia   . Arthritis     Past Surgical History  Procedure Laterality Date  . Esophagogastroduodenoscopy      remote past in the 1990s with dilation  . Kidney stone surgery  2011    Current Outpatient Prescriptions  Medication Sig Dispense Refill  . atenolol-chlorthalidone (TENORETIC) 100-25 MG per tablet Take 1 tablet by mouth daily.       . Cyanocobalamin (VITAMIN B-12) 500 MCG SUBL Place under the tongue daily.      Marland Kitchen loratadine (CLARITIN) 10 MG tablet Take 10 mg by mouth daily.      Marland Kitchen lovastatin (MEVACOR) 20 MG tablet Take 20 mg by mouth 2 (two) times daily.       . Magnesium 250 MG TABS Take 250 mg by mouth daily.      . metFORMIN (GLUCOPHAGE) 1000 MG tablet Take 1,000 mg by mouth 2 (two) times daily with a meal.       . potassium chloride (K-DUR) 10 MEQ tablet Take 10 mEq by mouth once.       . Potassium Gluconate 595 MG TBCR Take 595 mg by mouth daily.      . ranitidine (ZANTAC) 150 MG tablet Take 150 mg by mouth as needed for heartburn.      . traMADol (ULTRAM) 50 MG tablet Take 50 mg by mouth 2 (two) times daily.        No current facility-administered medications for this  visit.    Allergies as of 02/13/2013  . (No Known Allergies)    Family History  Problem Relation Age of Onset  . Colon cancer Neg Hx     History   Social History  . Marital Status: Single    Spouse Name: N/A    Number of Children: N/A  . Years of Education: N/A   Occupational History  . Not on file.   Social History Main Topics  . Smoking status: Never Smoker   . Smokeless tobacco: Not on file  . Alcohol Use: Yes     Comment: very very little  . Drug Use: No  . Sexual Activity: Not on file   Other Topics Concern  . Not on file   Social History Narrative  . No narrative on file    Review of Systems: Gen: Denies any fever, chills, fatigue, weight loss, lack of appetite.  CV: Denies chest pain, heart palpitations, peripheral edema, syncope.  Resp: Denies shortness of breath at rest or with exertion. Denies wheezing or cough.  GI: see HPI GU : Denies urinary burning, urinary frequency, urinary hesitancy MS: cane for walking as needed, forklift ran over right foot  Derm: Denies rash, itching,  dry skin Psych: Denies depression, anxiety, memory loss, and confusion Heme: Denies bruising, bleeding, and enlarged lymph nodes.  Physical Exam: BP 119/77  Pulse 60  Temp(Src) 97.8 F (36.6 C) (Oral)  Ht 5\' 8"  (1.727 m)  Wt 195 lb 9.6 oz (88.724 kg)  BMI 29.75 kg/m2 General:   Alert and oriented. Pleasant and cooperative. Well-nourished and well-developed.  Head:  Normocephalic and atraumatic. Eyes:  Without icterus, sclera clear and conjunctiva pink.  Ears:  Normal auditory acuity. Nose:  No deformity, discharge,  or lesions. Mouth:  No deformity or lesions, oral mucosa pink.  Neck:  Supple, without mass or thyromegaly. Lungs:  Clear to auscultation bilaterally. No wheezes, rales, or rhonchi. No distress.  Heart:  S1, S2 present without murmurs appreciated.  Abdomen:  +BS, soft, non-tender and non-distended. No HSM noted. No guarding or rebound. No masses  appreciated.  Rectal:  Deferred  Msk:  Symmetrical without gross deformities. Normal posture. Extremities:  Without clubbing or edema. Neurologic:  Alert and  oriented x4;  grossly normal neurologically. Skin:  Intact without significant lesions or rashes. Cervical Nodes:  No significant cervical adenopathy. Psych:  Alert and cooperative. Normal mood and affect.

## 2013-03-08 ENCOUNTER — Encounter: Payer: Self-pay | Admitting: Internal Medicine

## 2013-03-12 ENCOUNTER — Encounter (HOSPITAL_COMMUNITY): Payer: Self-pay | Admitting: Internal Medicine

## 2013-03-19 ENCOUNTER — Telehealth: Payer: Self-pay | Admitting: Internal Medicine

## 2013-03-19 NOTE — Telephone Encounter (Signed)
Pt received his letter regarding his results but has questions that he would like to ask the nurse. Please call him back at [phone]

## 2013-03-19 NOTE — Telephone Encounter (Signed)
I called patient and he understand the letters now

## 2013-06-10 ENCOUNTER — Encounter: Payer: Self-pay | Admitting: Internal Medicine

## 2015-03-05 DIAGNOSIS — I10 Essential (primary) hypertension: Secondary | ICD-10-CM | POA: Diagnosis not present

## 2015-03-05 DIAGNOSIS — M2550 Pain in unspecified joint: Secondary | ICD-10-CM | POA: Diagnosis not present

## 2015-04-09 DIAGNOSIS — E1165 Type 2 diabetes mellitus with hyperglycemia: Secondary | ICD-10-CM | POA: Diagnosis not present

## 2015-04-09 DIAGNOSIS — J209 Acute bronchitis, unspecified: Secondary | ICD-10-CM | POA: Diagnosis not present

## 2015-04-09 DIAGNOSIS — E119 Type 2 diabetes mellitus without complications: Secondary | ICD-10-CM | POA: Diagnosis not present

## 2015-04-09 DIAGNOSIS — Z6835 Body mass index (BMI) 35.0-35.9, adult: Secondary | ICD-10-CM | POA: Diagnosis not present

## 2015-04-09 DIAGNOSIS — I10 Essential (primary) hypertension: Secondary | ICD-10-CM | POA: Diagnosis not present

## 2015-04-09 DIAGNOSIS — Z789 Other specified health status: Secondary | ICD-10-CM | POA: Diagnosis not present

## 2015-04-09 DIAGNOSIS — M159 Polyosteoarthritis, unspecified: Secondary | ICD-10-CM | POA: Diagnosis not present

## 2015-05-19 DIAGNOSIS — I10 Essential (primary) hypertension: Secondary | ICD-10-CM | POA: Diagnosis not present

## 2015-05-19 DIAGNOSIS — M159 Polyosteoarthritis, unspecified: Secondary | ICD-10-CM | POA: Diagnosis not present

## 2015-05-19 DIAGNOSIS — E119 Type 2 diabetes mellitus without complications: Secondary | ICD-10-CM | POA: Diagnosis not present

## 2015-06-01 DIAGNOSIS — E1165 Type 2 diabetes mellitus with hyperglycemia: Secondary | ICD-10-CM | POA: Diagnosis not present

## 2015-06-01 DIAGNOSIS — M1990 Unspecified osteoarthritis, unspecified site: Secondary | ICD-10-CM | POA: Diagnosis not present

## 2015-06-01 DIAGNOSIS — I10 Essential (primary) hypertension: Secondary | ICD-10-CM | POA: Diagnosis not present

## 2015-06-01 DIAGNOSIS — G8929 Other chronic pain: Secondary | ICD-10-CM | POA: Diagnosis not present

## 2015-06-04 DIAGNOSIS — M19042 Primary osteoarthritis, left hand: Secondary | ICD-10-CM | POA: Diagnosis not present

## 2015-06-04 DIAGNOSIS — M19072 Primary osteoarthritis, left ankle and foot: Secondary | ICD-10-CM | POA: Diagnosis not present

## 2015-06-04 DIAGNOSIS — M19071 Primary osteoarthritis, right ankle and foot: Secondary | ICD-10-CM | POA: Diagnosis not present

## 2015-06-04 DIAGNOSIS — M19041 Primary osteoarthritis, right hand: Secondary | ICD-10-CM | POA: Diagnosis not present

## 2015-06-17 DIAGNOSIS — M1990 Unspecified osteoarthritis, unspecified site: Secondary | ICD-10-CM | POA: Diagnosis not present

## 2015-07-16 DIAGNOSIS — E1165 Type 2 diabetes mellitus with hyperglycemia: Secondary | ICD-10-CM | POA: Diagnosis not present

## 2015-07-16 DIAGNOSIS — I10 Essential (primary) hypertension: Secondary | ICD-10-CM | POA: Diagnosis not present

## 2015-07-16 DIAGNOSIS — Z299 Encounter for prophylactic measures, unspecified: Secondary | ICD-10-CM | POA: Diagnosis not present

## 2015-07-23 DIAGNOSIS — M159 Polyosteoarthritis, unspecified: Secondary | ICD-10-CM | POA: Diagnosis not present

## 2015-07-23 DIAGNOSIS — I10 Essential (primary) hypertension: Secondary | ICD-10-CM | POA: Diagnosis not present

## 2015-07-23 DIAGNOSIS — E119 Type 2 diabetes mellitus without complications: Secondary | ICD-10-CM | POA: Diagnosis not present

## 2015-07-30 DIAGNOSIS — Z299 Encounter for prophylactic measures, unspecified: Secondary | ICD-10-CM | POA: Diagnosis not present

## 2015-07-30 DIAGNOSIS — R5383 Other fatigue: Secondary | ICD-10-CM | POA: Diagnosis not present

## 2015-08-27 DIAGNOSIS — R5381 Other malaise: Secondary | ICD-10-CM | POA: Diagnosis not present

## 2015-08-27 DIAGNOSIS — M542 Cervicalgia: Secondary | ICD-10-CM | POA: Diagnosis not present

## 2015-08-27 DIAGNOSIS — L568 Other specified acute skin changes due to ultraviolet radiation: Secondary | ICD-10-CM | POA: Diagnosis not present

## 2015-08-27 DIAGNOSIS — R760 Raised antibody titer: Secondary | ICD-10-CM | POA: Diagnosis not present

## 2015-08-27 DIAGNOSIS — Z79899 Other long term (current) drug therapy: Secondary | ICD-10-CM | POA: Diagnosis not present

## 2015-08-27 DIAGNOSIS — M2550 Pain in unspecified joint: Secondary | ICD-10-CM | POA: Diagnosis not present

## 2015-08-27 DIAGNOSIS — R5383 Other fatigue: Secondary | ICD-10-CM | POA: Diagnosis not present

## 2015-08-27 DIAGNOSIS — R768 Other specified abnormal immunological findings in serum: Secondary | ICD-10-CM | POA: Diagnosis not present

## 2015-08-27 DIAGNOSIS — M545 Low back pain: Secondary | ICD-10-CM | POA: Diagnosis not present

## 2015-08-27 DIAGNOSIS — M170 Bilateral primary osteoarthritis of knee: Secondary | ICD-10-CM | POA: Diagnosis not present

## 2015-09-11 DIAGNOSIS — M159 Polyosteoarthritis, unspecified: Secondary | ICD-10-CM | POA: Diagnosis not present

## 2015-09-11 DIAGNOSIS — I10 Essential (primary) hypertension: Secondary | ICD-10-CM | POA: Diagnosis not present

## 2015-09-11 DIAGNOSIS — E119 Type 2 diabetes mellitus without complications: Secondary | ICD-10-CM | POA: Diagnosis not present

## 2015-09-17 DIAGNOSIS — Z299 Encounter for prophylactic measures, unspecified: Secondary | ICD-10-CM | POA: Diagnosis not present

## 2015-09-17 DIAGNOSIS — Z0000 Encounter for general adult medical examination without abnormal findings: Secondary | ICD-10-CM | POA: Diagnosis not present

## 2015-09-17 DIAGNOSIS — Z1389 Encounter for screening for other disorder: Secondary | ICD-10-CM | POA: Diagnosis not present

## 2015-09-17 DIAGNOSIS — Z1211 Encounter for screening for malignant neoplasm of colon: Secondary | ICD-10-CM | POA: Diagnosis not present

## 2015-09-17 DIAGNOSIS — E7800 Pure hypercholesterolemia, unspecified: Secondary | ICD-10-CM | POA: Diagnosis not present

## 2015-09-17 DIAGNOSIS — Z7189 Other specified counseling: Secondary | ICD-10-CM | POA: Diagnosis not present

## 2015-10-01 DIAGNOSIS — M79671 Pain in right foot: Secondary | ICD-10-CM | POA: Diagnosis not present

## 2015-10-01 DIAGNOSIS — Z23 Encounter for immunization: Secondary | ICD-10-CM | POA: Diagnosis not present

## 2015-10-01 DIAGNOSIS — Z09 Encounter for follow-up examination after completed treatment for conditions other than malignant neoplasm: Secondary | ICD-10-CM | POA: Diagnosis not present

## 2015-10-01 DIAGNOSIS — M79641 Pain in right hand: Secondary | ICD-10-CM | POA: Diagnosis not present

## 2015-10-01 DIAGNOSIS — M25561 Pain in right knee: Secondary | ICD-10-CM | POA: Diagnosis not present

## 2015-10-08 DIAGNOSIS — Z6834 Body mass index (BMI) 34.0-34.9, adult: Secondary | ICD-10-CM | POA: Diagnosis not present

## 2015-10-08 DIAGNOSIS — E1165 Type 2 diabetes mellitus with hyperglycemia: Secondary | ICD-10-CM | POA: Diagnosis not present

## 2015-10-08 DIAGNOSIS — Z79899 Other long term (current) drug therapy: Secondary | ICD-10-CM | POA: Diagnosis not present

## 2015-10-08 DIAGNOSIS — Z713 Dietary counseling and surveillance: Secondary | ICD-10-CM | POA: Diagnosis not present

## 2015-10-08 DIAGNOSIS — I10 Essential (primary) hypertension: Secondary | ICD-10-CM | POA: Diagnosis not present

## 2015-10-29 DIAGNOSIS — H40053 Ocular hypertension, bilateral: Secondary | ICD-10-CM | POA: Diagnosis not present

## 2015-11-05 DIAGNOSIS — Z79899 Other long term (current) drug therapy: Secondary | ICD-10-CM | POA: Diagnosis not present

## 2015-11-17 DIAGNOSIS — E119 Type 2 diabetes mellitus without complications: Secondary | ICD-10-CM | POA: Diagnosis not present

## 2015-11-17 DIAGNOSIS — M159 Polyosteoarthritis, unspecified: Secondary | ICD-10-CM | POA: Diagnosis not present

## 2015-11-17 DIAGNOSIS — I10 Essential (primary) hypertension: Secondary | ICD-10-CM | POA: Diagnosis not present

## 2015-11-19 DIAGNOSIS — Z6833 Body mass index (BMI) 33.0-33.9, adult: Secondary | ICD-10-CM | POA: Diagnosis not present

## 2015-11-19 DIAGNOSIS — M2550 Pain in unspecified joint: Secondary | ICD-10-CM | POA: Diagnosis not present

## 2015-11-19 DIAGNOSIS — Z299 Encounter for prophylactic measures, unspecified: Secondary | ICD-10-CM | POA: Diagnosis not present

## 2015-11-19 DIAGNOSIS — M329 Systemic lupus erythematosus, unspecified: Secondary | ICD-10-CM | POA: Diagnosis not present

## 2015-11-19 DIAGNOSIS — E1165 Type 2 diabetes mellitus with hyperglycemia: Secondary | ICD-10-CM | POA: Diagnosis not present

## 2015-11-19 DIAGNOSIS — G4700 Insomnia, unspecified: Secondary | ICD-10-CM | POA: Diagnosis not present

## 2015-12-11 DIAGNOSIS — E119 Type 2 diabetes mellitus without complications: Secondary | ICD-10-CM | POA: Diagnosis not present

## 2015-12-11 DIAGNOSIS — I10 Essential (primary) hypertension: Secondary | ICD-10-CM | POA: Diagnosis not present

## 2015-12-11 DIAGNOSIS — M159 Polyosteoarthritis, unspecified: Secondary | ICD-10-CM | POA: Diagnosis not present

## 2015-12-23 DIAGNOSIS — I10 Essential (primary) hypertension: Secondary | ICD-10-CM | POA: Diagnosis not present

## 2015-12-23 DIAGNOSIS — Z6833 Body mass index (BMI) 33.0-33.9, adult: Secondary | ICD-10-CM | POA: Diagnosis not present

## 2015-12-23 DIAGNOSIS — Z299 Encounter for prophylactic measures, unspecified: Secondary | ICD-10-CM | POA: Diagnosis not present

## 2015-12-23 DIAGNOSIS — M25512 Pain in left shoulder: Secondary | ICD-10-CM | POA: Diagnosis not present

## 2015-12-23 DIAGNOSIS — G8929 Other chronic pain: Secondary | ICD-10-CM | POA: Diagnosis not present

## 2015-12-29 DIAGNOSIS — R768 Other specified abnormal immunological findings in serum: Secondary | ICD-10-CM | POA: Insufficient documentation

## 2015-12-29 DIAGNOSIS — R7689 Other specified abnormal immunological findings in serum: Secondary | ICD-10-CM | POA: Insufficient documentation

## 2015-12-29 DIAGNOSIS — L568 Other specified acute skin changes due to ultraviolet radiation: Secondary | ICD-10-CM | POA: Insufficient documentation

## 2015-12-29 DIAGNOSIS — Z79899 Other long term (current) drug therapy: Secondary | ICD-10-CM | POA: Insufficient documentation

## 2015-12-29 NOTE — Progress Notes (Signed)
Office Visit Note  Patient: Daryl Bell             Date of Birth: 06/10/1950           MRN: [id number]             PCP: Monico Blitz, MD , Referring: Monico Blitz, MD Visit Date: 12/31/2015 Occupation: @GUAROCC @    Subjective:  Pain of the Left Shoulder (Has seen PCP states had xray and will have MRI soon) and Neck Pain   History of Present Illness: Daryl Bell is a 65 y.o. male  Last seen 10/01/2015. Patient has autoimmune workup done back in August 2017 where his double-stranded DNA was +57 with ANA positive with a titer of 1:80. His IgM was decreased to 14. We started the patient on Plaquenil on that visit and he is doing well currently. He did do his Plaquenil baseline eye exam and that was also negative. Please find his forearm from 11/05/2015 in [facility].  His only complaint is that he is having some left shoulder joint pain. The left shoulder joint pain is at the superior portion of the lateral scapular border. At times she states that the pain radiates from that area into the shoulder joint and down his arm to the elbow.  His family physician, Dr. Brigitte Pulse of either internal medicine, his recently done x-ray of the neck which shows C4 disc narrowing according to the patient as well as the left shoulder joint x-ray. The x-ray was done just recently and it needs to be reviewed with the patient. I do not have access to those x-rays are not reminded the patient to finish the care through Dr. Trena Platt office since they're working on it. If indicated they plan to do an MRI of according to the patient.   Currently he is asymptomatic from his autoimmune disease. His joints are doing well. No oral or nasal ulcers. No new rashes except for the ongoing vitiligo that he's had for years. No cervical lymphadenopathy   Activities of Daily Living:  Patient reports morning stiffness for 30 minutes.   Patient Denies nocturnal pain.  Difficulty dressing/grooming: Denies Difficulty  climbing stairs: Denies Difficulty getting out of chair: Denies Difficulty using hands for taps, buttons, cutlery, and/or writing: Denies   Review of Systems  Constitutional: Negative for fatigue.  HENT: Negative for mouth sores and mouth dryness.   Eyes: Negative for dryness.  Respiratory: Negative for shortness of breath.   Gastrointestinal: Negative for constipation and diarrhea.  Musculoskeletal: Negative for myalgias and myalgias.  Skin: Negative for sensitivity to sunlight.  Neurological: Negative for memory loss.  Psychiatric/Behavioral: Negative for sleep disturbance.    PMFS History:  Patient Active Problem List   Diagnosis Date Noted  . ANA positive 12/29/2015  . 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 Photosensitivity 12/29/2015  . High risk medication use 12/29/2015  . Other dysphagia 02/13/2013  . Encounter for screening colonoscopy 02/13/2013    Past Medical History:  Diagnosis Date  . Arthritis   . Diabetes ([place])   . GERD (gastroesophageal reflux disease)   . Hypercholesterolemia   . Hypertension   . Kidney calculus     Family History  Problem Relation Age of Onset  . Colon cancer Neg Hx    Past Surgical History:  Procedure Laterality Date  . COLONOSCOPY, ESOPHAGOGASTRODUODENOSCOPY (EGD) AND ESOPHAGEAL DILATION N/A 03/07/2013   Procedure: COLONOSCOPY, ESOPHAGOGASTRODUODENOSCOPY (EGD) AND ESOPHAGEAL DILATION;  Surgeon: Daneil Dolin, MD;  [facility];  Service: Endoscopy;  Laterality: N/A;  11:15  . ESOPHAGOGASTRODUODENOSCOPY     remote past in the 1990s with dilation  . KIDNEY STONE SURGERY  2011   Social History   Social History Narrative  . No narrative on file     Objective: Vital Signs: BP 130/74   Pulse 76   Resp 16   Ht 5\' 7"  (1.702 m)   Wt 222 lb (100.7 kg)   BMI 34.77 kg/m    Physical Exam  Constitutional: He is oriented to person, place, and time. He appears well-developed and well-nourished.  HENT:  Head:  Normocephalic and atraumatic.  Eyes: Conjunctivae and EOM are normal. Pupils are equal, round, and reactive to light.  Neck: Normal range of motion. Neck supple.  Cardiovascular: Normal rate, regular rhythm and normal heart sounds.  Exam reveals no gallop and no friction rub.   No murmur heard. Pulmonary/Chest: Effort normal and breath sounds normal. No respiratory distress. He has no wheezes. He has no rales. He exhibits no tenderness.  Abdominal: Soft. He exhibits no distension and no mass. There is no tenderness. There is no guarding.  Musculoskeletal: Normal range of motion.  Lymphadenopathy:    He has no cervical adenopathy.  Neurological: He is alert and oriented to person, place, and time. He exhibits normal muscle tone. Coordination normal.  Skin: Skin is warm and dry. Capillary refill takes less than 2 seconds. No rash noted.  Psychiatric: He has a normal mood and affect. His behavior is normal. Judgment and thought content normal.  Nursing note and vitals reviewed.    Musculoskeletal Exam:  Full range of motion of all joints Note: Very good range of motion of left shoulder joint but patient states that he has pain when he is doing the range of motion activities. Grip strength is equal and strong bilaterally Fibromyalgia tender points are all absent  CDAI Exam: No CDAI exam completed.    Investigation: Findings:  10/08/2015 CBC CMP normal   Labs from August 27, 2015 shows lupus anticoagulant negative, CMP with GFR normal, CBC with diff normal, sed rate normal, sed rate normal at 6, Hep panel negative, CK negative, TSH negative, HIV negative, IgG, IgA negative, IgM is slightly low at 121, ENA is negative except for double-stranded DNA positive.  ANA is positive with a titer of 1:80.  Urinalysis is positive for 1+ glucose, and trace of ketone and blood and microscopic shows calcium oxalate crystals.  X-ray of bilateral hands 2  views according to report in May of 2017 showed [facility]  joint arthritis and PIP narrowing consistent with osteoarthritis and x-ray of bilateral feet per radiologist report were read as osteoarthritis in his feet 08/27/2015.X-ray of bilateral knee joints, 2 views  which showed bilateral moderate compartment narrowing.  Left knee joint had some spurring and bilateral moderate patellofemoral narrowing.  These findings were consistent with moderate osteoarthritis and moderate chondromalacia patella.     Imaging: No results found.  Speciality Comments: No specialty comments available.    Procedures:  No procedures performed Allergies: Patient has no known allergies.   Assessment / Plan:     Visit Diagnoses: Neck pain  ANA positive  Ds DNA antibody positive  Photosensitivity  High risk medication use - Plaquenil 200 mg twice a day started August 2017 and doing well. - Plan: CBC with Differential/Platelet, COMPLETE METABOLIC PANEL WITH GFR  Acute pain of left shoulder - addressed by pcp, dr Manuella Ghazi of [place], [place];   Refill Plaquenil for 30  day supply with 2 refills CBC with differential CMP with GFR today Follow with Dr. Donneta Romberg regarding his neck pain and shoulder joint pain on the left side with radiculopathy  Return to clinic in 3 months  Note that he is doing really well with his lupus. No flares. He does have osteoarthritis with pain to her hands feet and knee joint.   Orders: Orders Placed This Encounter  Procedures  . CBC with Differential/Platelet  . COMPLETE METABOLIC PANEL WITH GFR   Meds ordered this encounter  Medications  . hydroxychloroquine (PLAQUENIL) 200 MG tablet    Sig: Take 1 tablet (200 mg total) by mouth 2 (two) times daily.    Dispense:  60 tablet    Refill:  2    Order Specific Question:   Supervising Provider    Answer:   Bo Merino [phone]    Face-to-face time spent with patient was 30 minutes. 50% of time was spent in counseling and coordination of care.  Follow-Up Instructions: Return in about  3 months (around 03/30/2016) for sle,plq,neck pain, left sj pain.   Eliezer Lofts, PA-C I examined and evaluated the patient with Eliezer Lofts PA. The plan of care was discussed as noted above.  Bo Merino, MD

## 2015-12-30 DIAGNOSIS — M25512 Pain in left shoulder: Secondary | ICD-10-CM | POA: Diagnosis not present

## 2015-12-31 ENCOUNTER — Encounter: Payer: Self-pay | Admitting: Rheumatology

## 2015-12-31 ENCOUNTER — Ambulatory Visit (INDEPENDENT_AMBULATORY_CARE_PROVIDER_SITE_OTHER): Payer: Medicare Other | Admitting: Rheumatology

## 2015-12-31 VITALS — BP 130/74 | HR 76 | Resp 16 | Ht 67.0 in | Wt 222.0 lb

## 2015-12-31 DIAGNOSIS — M25512 Pain in left shoulder: Secondary | ICD-10-CM | POA: Diagnosis not present

## 2015-12-31 DIAGNOSIS — R768 Other specified abnormal immunological findings in serum: Secondary | ICD-10-CM | POA: Diagnosis not present

## 2015-12-31 DIAGNOSIS — M542 Cervicalgia: Secondary | ICD-10-CM | POA: Diagnosis not present

## 2015-12-31 DIAGNOSIS — Z79899 Other long term (current) drug therapy: Secondary | ICD-10-CM | POA: Diagnosis not present

## 2015-12-31 DIAGNOSIS — L568 Other specified acute skin changes due to ultraviolet radiation: Secondary | ICD-10-CM | POA: Diagnosis not present

## 2015-12-31 LAB — CBC WITH DIFFERENTIAL/PLATELET
Basophils Absolute: 123 cells/uL (ref 0–200)
Basophils Relative: 1 %
EOS ABS: 246 {cells}/uL (ref 15–500)
Eosinophils Relative: 2 %
HCT: 45.3 % (ref 38.5–50.0)
HEMOGLOBIN: 15 g/dL (ref 13.2–17.1)
Lymphocytes Relative: 23 %
Lymphs Abs: 2829 cells/uL (ref 850–3900)
MCH: 29.4 pg (ref 27.0–33.0)
MCHC: 33.1 g/dL (ref 32.0–36.0)
MCV: 88.6 fL (ref 80.0–100.0)
MPV: 10.8 fL (ref 7.5–12.5)
Monocytes Absolute: 984 cells/uL — ABNORMAL HIGH (ref 200–950)
Monocytes Relative: 8 %
NEUTROS ABS: 8118 {cells}/uL — AB (ref 1500–7800)
NEUTROS PCT: 66 %
Platelets: 297 10*3/uL (ref 140–400)
RBC: 5.11 MIL/uL (ref 4.20–5.80)
RDW: 13.7 % (ref 11.0–15.0)
WBC: 12.3 10*3/uL — ABNORMAL HIGH (ref 3.8–10.8)

## 2015-12-31 LAB — COMPLETE METABOLIC PANEL WITH GFR
ALBUMIN: 4.1 g/dL (ref 3.6–5.1)
ALK PHOS: 41 U/L (ref 40–115)
ALT: 19 U/L (ref 9–46)
AST: 12 U/L (ref 10–35)
BILIRUBIN TOTAL: 0.3 mg/dL (ref 0.2–1.2)
BUN: 19 mg/dL (ref 7–25)
CO2: 29 mmol/L (ref 20–31)
Calcium: 8.8 mg/dL (ref 8.6–10.3)
Chloride: 99 mmol/L (ref 98–110)
Creat: 1.08 mg/dL (ref 0.70–1.25)
GFR, EST AFRICAN AMERICAN: 84 mL/min (ref >=60)
GFR, EST NON AFRICAN AMERICAN: 73 mL/min (ref >=60)
Glucose, Bld: 142 mg/dL — ABNORMAL HIGH (ref 65–99)
Potassium: 4.3 mmol/L (ref 3.5–5.3)
Sodium: 138 mmol/L (ref 135–146)
TOTAL PROTEIN: 6.1 g/dL (ref 6.1–8.1)

## 2015-12-31 MED ORDER — HYDROXYCHLOROQUINE SULFATE 200 MG PO TABS: 200.0000 mg | ORAL_TABLET | Freq: Two times a day (BID) | ORAL | 2 refills | Status: DC

## 2015-12-31 NOTE — Patient Instructions (Signed)
Supplements for OA Natural anti-inflammatories  You can purchase these at [employer], [employer] or online.  . Turmeric (capsules)  . Ginger (ginger root or capsules)  . Omega 3 (Fish, flax seeds, chia seeds, walnuts, almonds)  . Tart cherry (dried or extract)   Patient should be under the care of a physician while taking these supplements. This may not be reproduced without the permission of Dr. Shaili Deveshwar.  

## 2016-01-01 NOTE — Progress Notes (Signed)
Informed patient#1: CMP with GFR is normal except for nonfasting glucose slightly elevated at 142#2: CBC with differential is normal except for WBC slightly elevated at 12.3.He should monitor this with his PCP. Please for these labs to Dr. Manuella Ghazi in [place], [place]

## 2016-01-06 DIAGNOSIS — Z6833 Body mass index (BMI) 33.0-33.9, adult: Secondary | ICD-10-CM | POA: Diagnosis not present

## 2016-01-06 DIAGNOSIS — Z713 Dietary counseling and surveillance: Secondary | ICD-10-CM | POA: Diagnosis not present

## 2016-01-06 DIAGNOSIS — Z299 Encounter for prophylactic measures, unspecified: Secondary | ICD-10-CM | POA: Diagnosis not present

## 2016-01-06 DIAGNOSIS — M25512 Pain in left shoulder: Secondary | ICD-10-CM | POA: Diagnosis not present

## 2016-01-11 DIAGNOSIS — I10 Essential (primary) hypertension: Secondary | ICD-10-CM | POA: Diagnosis not present

## 2016-01-11 DIAGNOSIS — M25512 Pain in left shoulder: Secondary | ICD-10-CM | POA: Diagnosis not present

## 2016-01-11 DIAGNOSIS — E119 Type 2 diabetes mellitus without complications: Secondary | ICD-10-CM | POA: Diagnosis not present

## 2016-01-11 DIAGNOSIS — M7592 Shoulder lesion, unspecified, left shoulder: Secondary | ICD-10-CM | POA: Diagnosis not present

## 2016-01-11 DIAGNOSIS — M75102 Unspecified rotator cuff tear or rupture of left shoulder, not specified as traumatic: Secondary | ICD-10-CM | POA: Diagnosis not present

## 2016-01-11 DIAGNOSIS — R938 Abnormal findings on diagnostic imaging of other specified body structures: Secondary | ICD-10-CM | POA: Diagnosis not present

## 2016-01-11 DIAGNOSIS — M159 Polyosteoarthritis, unspecified: Secondary | ICD-10-CM | POA: Diagnosis not present

## 2016-01-11 DIAGNOSIS — Z01818 Encounter for other preprocedural examination: Secondary | ICD-10-CM | POA: Diagnosis not present

## 2016-01-21 DIAGNOSIS — M25512 Pain in left shoulder: Secondary | ICD-10-CM | POA: Diagnosis not present

## 2016-01-27 DIAGNOSIS — M25512 Pain in left shoulder: Secondary | ICD-10-CM | POA: Diagnosis not present

## 2016-01-28 ENCOUNTER — Encounter: Payer: Self-pay | Admitting: Internal Medicine

## 2016-01-28 DIAGNOSIS — M25512 Pain in left shoulder: Secondary | ICD-10-CM | POA: Diagnosis not present

## 2016-01-29 DIAGNOSIS — S43432A Superior glenoid labrum lesion of left shoulder, initial encounter: Secondary | ICD-10-CM | POA: Diagnosis not present

## 2016-01-29 DIAGNOSIS — M7522 Bicipital tendinitis, left shoulder: Secondary | ICD-10-CM | POA: Diagnosis not present

## 2016-01-29 DIAGNOSIS — M5412 Radiculopathy, cervical region: Secondary | ICD-10-CM | POA: Diagnosis not present

## 2016-01-29 DIAGNOSIS — M25512 Pain in left shoulder: Secondary | ICD-10-CM | POA: Diagnosis not present

## 2016-02-03 DIAGNOSIS — M159 Polyosteoarthritis, unspecified: Secondary | ICD-10-CM | POA: Diagnosis not present

## 2016-02-03 DIAGNOSIS — E119 Type 2 diabetes mellitus without complications: Secondary | ICD-10-CM | POA: Diagnosis not present

## 2016-02-03 DIAGNOSIS — I10 Essential (primary) hypertension: Secondary | ICD-10-CM | POA: Diagnosis not present

## 2016-02-05 DIAGNOSIS — Z299 Encounter for prophylactic measures, unspecified: Secondary | ICD-10-CM | POA: Diagnosis not present

## 2016-02-05 DIAGNOSIS — I10 Essential (primary) hypertension: Secondary | ICD-10-CM | POA: Diagnosis not present

## 2016-02-05 DIAGNOSIS — I129 Hypertensive chronic kidney disease with stage 1 through stage 4 chronic kidney disease, or unspecified chronic kidney disease: Secondary | ICD-10-CM | POA: Diagnosis not present

## 2016-02-05 DIAGNOSIS — Z6833 Body mass index (BMI) 33.0-33.9, adult: Secondary | ICD-10-CM | POA: Diagnosis not present

## 2016-02-05 DIAGNOSIS — M25512 Pain in left shoulder: Secondary | ICD-10-CM | POA: Diagnosis not present

## 2016-02-05 DIAGNOSIS — E7800 Pure hypercholesterolemia, unspecified: Secondary | ICD-10-CM | POA: Diagnosis not present

## 2016-02-05 DIAGNOSIS — Z789 Other specified health status: Secondary | ICD-10-CM | POA: Diagnosis not present

## 2016-02-05 DIAGNOSIS — Z79899 Other long term (current) drug therapy: Secondary | ICD-10-CM | POA: Diagnosis not present

## 2016-02-05 DIAGNOSIS — E1122 Type 2 diabetes mellitus with diabetic chronic kidney disease: Secondary | ICD-10-CM | POA: Diagnosis not present

## 2016-02-05 DIAGNOSIS — M329 Systemic lupus erythematosus, unspecified: Secondary | ICD-10-CM | POA: Diagnosis not present

## 2016-02-05 DIAGNOSIS — G8929 Other chronic pain: Secondary | ICD-10-CM | POA: Diagnosis not present

## 2016-02-26 DIAGNOSIS — M5412 Radiculopathy, cervical region: Secondary | ICD-10-CM | POA: Diagnosis not present

## 2016-02-26 DIAGNOSIS — M7522 Bicipital tendinitis, left shoulder: Secondary | ICD-10-CM | POA: Diagnosis not present

## 2016-02-26 DIAGNOSIS — G5602 Carpal tunnel syndrome, left upper limb: Secondary | ICD-10-CM | POA: Diagnosis not present

## 2016-02-26 DIAGNOSIS — S43432A Superior glenoid labrum lesion of left shoulder, initial encounter: Secondary | ICD-10-CM | POA: Diagnosis not present

## 2016-03-02 DIAGNOSIS — E7800 Pure hypercholesterolemia, unspecified: Secondary | ICD-10-CM | POA: Diagnosis not present

## 2016-03-02 DIAGNOSIS — N181 Chronic kidney disease, stage 1: Secondary | ICD-10-CM | POA: Diagnosis not present

## 2016-03-02 DIAGNOSIS — E1322 Other specified diabetes mellitus with diabetic chronic kidney disease: Secondary | ICD-10-CM | POA: Diagnosis not present

## 2016-03-02 DIAGNOSIS — E1365 Other specified diabetes mellitus with hyperglycemia: Secondary | ICD-10-CM | POA: Diagnosis not present

## 2016-03-02 DIAGNOSIS — Z299 Encounter for prophylactic measures, unspecified: Secondary | ICD-10-CM | POA: Diagnosis not present

## 2016-03-02 DIAGNOSIS — Z6833 Body mass index (BMI) 33.0-33.9, adult: Secondary | ICD-10-CM | POA: Diagnosis not present

## 2016-03-02 DIAGNOSIS — Z713 Dietary counseling and surveillance: Secondary | ICD-10-CM | POA: Diagnosis not present

## 2016-03-02 DIAGNOSIS — M2550 Pain in unspecified joint: Secondary | ICD-10-CM | POA: Diagnosis not present

## 2016-03-23 DIAGNOSIS — E119 Type 2 diabetes mellitus without complications: Secondary | ICD-10-CM | POA: Diagnosis not present

## 2016-03-23 DIAGNOSIS — M159 Polyosteoarthritis, unspecified: Secondary | ICD-10-CM | POA: Diagnosis not present

## 2016-03-23 DIAGNOSIS — I10 Essential (primary) hypertension: Secondary | ICD-10-CM | POA: Diagnosis not present

## 2016-03-25 DIAGNOSIS — S43432A Superior glenoid labrum lesion of left shoulder, initial encounter: Secondary | ICD-10-CM | POA: Diagnosis not present

## 2016-03-25 DIAGNOSIS — M4722 Other spondylosis with radiculopathy, cervical region: Secondary | ICD-10-CM | POA: Diagnosis not present

## 2016-03-25 DIAGNOSIS — M7522 Bicipital tendinitis, left shoulder: Secondary | ICD-10-CM | POA: Diagnosis not present

## 2016-03-31 ENCOUNTER — Ambulatory Visit (INDEPENDENT_AMBULATORY_CARE_PROVIDER_SITE_OTHER): Payer: Medicare Other | Admitting: Rheumatology

## 2016-03-31 ENCOUNTER — Encounter: Payer: Self-pay | Admitting: Rheumatology

## 2016-03-31 VITALS — BP 132/89 | HR 83 | Resp 13 | Ht 67.0 in | Wt 217.0 lb

## 2016-03-31 DIAGNOSIS — L568 Other specified acute skin changes due to ultraviolet radiation: Secondary | ICD-10-CM | POA: Diagnosis not present

## 2016-03-31 DIAGNOSIS — R768 Other specified abnormal immunological findings in serum: Secondary | ICD-10-CM | POA: Diagnosis not present

## 2016-03-31 DIAGNOSIS — M542 Cervicalgia: Secondary | ICD-10-CM | POA: Diagnosis not present

## 2016-03-31 DIAGNOSIS — M359 Systemic involvement of connective tissue, unspecified: Secondary | ICD-10-CM

## 2016-03-31 DIAGNOSIS — Z79899 Other long term (current) drug therapy: Secondary | ICD-10-CM | POA: Diagnosis not present

## 2016-03-31 DIAGNOSIS — M25512 Pain in left shoulder: Secondary | ICD-10-CM | POA: Insufficient documentation

## 2016-03-31 LAB — CBC WITH DIFFERENTIAL/PLATELET
BASOS ABS: 136 {cells}/uL (ref 0–200)
Basophils Relative: 1 %
EOS PCT: 3 %
Eosinophils Absolute: 408 cells/uL (ref 15–500)
HCT: 44.3 % (ref 38.5–50.0)
HEMOGLOBIN: 14.8 g/dL (ref 13.2–17.1)
Lymphocytes Relative: 29 %
Lymphs Abs: 3944 cells/uL — ABNORMAL HIGH (ref 850–3900)
MCH: 30.6 pg (ref 27.0–33.0)
MCHC: 33.4 g/dL (ref 32.0–36.0)
MCV: 91.5 fL (ref 80.0–100.0)
MPV: 10.4 fL (ref 7.5–12.5)
Monocytes Absolute: 1224 cells/uL — ABNORMAL HIGH (ref 200–950)
Monocytes Relative: 9 %
NEUTROS ABS: 7888 {cells}/uL — AB (ref 1500–7800)
Neutrophils Relative %: 58 %
Platelets: 316 10*3/uL (ref 140–400)
RBC: 4.84 MIL/uL (ref 4.20–5.80)
RDW: 13.4 % (ref 11.0–15.0)
WBC: 13.6 10*3/uL — AB (ref 3.8–10.8)

## 2016-03-31 LAB — COMPLETE METABOLIC PANEL WITH GFR
ALBUMIN: 4.3 g/dL (ref 3.6–5.1)
ALK PHOS: 43 U/L (ref 40–115)
ALT: 17 U/L (ref 9–46)
AST: 12 U/L (ref 10–35)
BUN: 18 mg/dL (ref 7–25)
CO2: 28 mmol/L (ref 20–31)
Calcium: 9.4 mg/dL (ref 8.6–10.3)
Chloride: 94 mmol/L — ABNORMAL LOW (ref 98–110)
Creat: 1.12 mg/dL (ref 0.70–1.25)
GFR, Est African American: 80 mL/min (ref >=60)
GFR, Est Non African American: 70 mL/min (ref >=60)
GLUCOSE: 136 mg/dL — AB (ref 65–99)
Potassium: 4.5 mmol/L (ref 3.5–5.3)
Sodium: 136 mmol/L (ref 135–146)
TOTAL PROTEIN: 6.4 g/dL (ref 6.1–8.1)
Total Bilirubin: 0.4 mg/dL (ref 0.2–1.2)

## 2016-03-31 MED ORDER — HYDROXYCHLOROQUINE SULFATE 200 MG PO TABS: 200.0000 mg | ORAL_TABLET | Freq: Two times a day (BID) | ORAL | 1 refills | Status: DC

## 2016-03-31 NOTE — Progress Notes (Signed)
Office Visit Note  Patient: Daryl Bell             Date of Birth: 1950/12/21           MRN: [id number]             PCP: Monico Blitz, MD Referring: Monico Blitz, MD Visit Date: 03/31/2016 Occupation: _0 @    Subjective:  Follow-up Autoimmune disease. High-risk prescription.  History of Present Illness: Daryl Bell is a 66 y.o. male   Last seen in our office on 12/01/2015. Requested to come back in 3 months and patient is here for that now.  Patient reports: MRI  Left shoulder joint done by orthopedist (dr. Gildardo Cranker, [place]) Labral cyst Had injections with relief (doing well w/ last shot x 2 last week). f-u  On April 29, 2016.  Also pt's doctor is planning on having MRI of neck due to pinched nerve type symptoms  Patient is compliant with his medication and doing well with the Plaquenil 200 mg twice a day every day. Is noticed considerable improvement in his joints. His arthralgia is very well controlled. He is tolerating the medication well. It is affordable. No oral ulcers, no nasal ulcers. No fatigue. No lupus rash. No other complaints at this time.  Patient had a baseline Plaquenil eye exam October 2017 and was normal. I've asked the patient to get a repeat Plaquenil eye exam in 6 months from October and then he can do it annually thereafter. Patient is agreeable. He does need his Plaquenil refilled He also needs updated lab work which includes urinalysis which we will get today.    Activities of Daily Living:  Patient reports morning stiffness for 15 minutes.   Patient deniesnocturnal pain.  Difficulty dressing/grooming: Denies Difficulty climbing stairs: Denies Difficulty getting out of chair: Denies Difficulty using hands for taps, buttons, cutlery, and/or writing: Denies   Review of Systems  Constitutional: Negative for fatigue.  HENT: Negative for mouth sores and mouth dryness.   Eyes: Negative for dryness.  Respiratory:  Negative for shortness of breath.   Gastrointestinal: Negative for constipation and diarrhea.  Musculoskeletal: Negative for myalgias and myalgias.  Skin: Negative for sensitivity to sunlight.  Neurological: Negative for memory loss.  Psychiatric/Behavioral: Negative for sleep disturbance.    PMFS History:  Patient Active Problem List   Diagnosis Date Noted  . Neck pain 03/31/2016  . Acute pain of left shoulder 03/31/2016  . ANA positive 12/29/2015  . 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 Photosensitivity 12/29/2015  . High risk medication use 12/29/2015  . Other dysphagia 02/13/2013  . Encounter for screening colonoscopy 02/13/2013    Past Medical History:  Diagnosis Date  . Arthritis   . Diabetes ([place])   . GERD (gastroesophageal reflux disease)   . Hypercholesterolemia   . Hypertension   . Kidney calculus     Family History  Problem Relation Age of Onset  . Colon cancer Neg Hx    Past Surgical History:  Procedure Laterality Date  . COLONOSCOPY, ESOPHAGOGASTRODUODENOSCOPY (EGD) AND ESOPHAGEAL DILATION N/A 03/07/2013   Procedure: COLONOSCOPY, ESOPHAGOGASTRODUODENOSCOPY (EGD) AND ESOPHAGEAL DILATION;  Surgeon: Daneil Dolin, MD;  [facility];  Service: Endoscopy;  Laterality: N/A;  11:15  . ESOPHAGOGASTRODUODENOSCOPY     remote past in the 1990s with dilation  . KIDNEY STONE SURGERY  2011   Social History   Social History Narrative  . No narrative on file     Objective: Vital Signs:  BP 132/89   Pulse 83   Resp 13   Ht _0  (1.702 m)   Wt 217 lb (98.4 kg)   BMI 33.99 kg/m    Physical Exam  Constitutional: He is oriented to person, place, and time. He appears well-developed and well-nourished.  HENT:  Head: Normocephalic and atraumatic.  Eyes: Conjunctivae and EOM are normal. Pupils are equal, round, and reactive to light.  Neck: Normal range of motion. Neck supple.  Cardiovascular: Normal rate, regular rhythm and normal heart sounds.  Exam  reveals no gallop and no friction rub.   No murmur heard. Pulmonary/Chest: Effort normal and breath sounds normal. No respiratory distress. He has no wheezes. He has no rales. He exhibits no tenderness.  Abdominal: Soft. He exhibits no distension and no mass. There is no tenderness. There is no guarding.  Musculoskeletal: Normal range of motion.  Lymphadenopathy:    He has no cervical adenopathy.  Neurological: He is alert and oriented to person, place, and time. He exhibits normal muscle tone. Coordination normal.  Skin: Skin is warm and dry. Capillary refill takes less than 2 seconds. No rash noted.  Psychiatric: He has a normal mood and affect. His behavior is normal. Judgment and thought content normal.  Nursing note and vitals reviewed.    Musculoskeletal Exam:  Full range of motion of all joints Grip strength is equal and strong bilaterally For myalgia tender points are all absent  CDAI Exam: CDAI Homunculus Exam:   Joint Counts:  CDAI Tender Joint count: 0 CDAI Swollen Joint count: 0  Global Assessments:  Patient Global Assessment: 0 Provider Global Assessment: 0    Investigation: Findings:  Labs from August 27, 2015 shows lupus anticoagulant negative, CMP with GFR normal, CBC with diff normal, sed rate normal, sed rate normal at 6, Hep panel negative, CK negative, TSH negative, HIV negative, IgG, IgA negative, IgM is slightly low at 121, ENA is negative except for double-stranded DNA positive.  ANA is positive with a titer of 1:80.  Urinalysis is positive for 1+ glucose, and trace of ketone and blood and microscopic shows calcium oxalate crystals.  X-ray of bilateral hands 2  views according to report in May of 2017 showed [facility] joint arthritis and PIP narrowing consistent with osteoarthritis and x-ray of bilateral feet per radiologist report were read as osteoarthritis in his feet 08/27/2015.X-ray of bilateral knee joints, 2 views  which showed bilateral moderate compartment  narrowing.  Left knee joint had some spurring and bilateral moderate patellofemoral narrowing.  These findings were consistent with moderate osteoarthritis and moderate chondromalacia patella.  Plaquenil baseline eye exam negative,(In [facility] 11/05/2015)  Office Visit on 12/31/2015  Component Date Value Ref Range Status  . WBC 12/31/2015 12.3* 3.8 - 10.8 K/uL Final  . RBC 12/31/2015 5.11  4.20 - 5.80 MIL/uL Final  . Hemoglobin 12/31/2015 15.0  13.2 - 17.1 g/dL Final  . HCT 12/31/2015 45.3  38.5 - 50.0 % Final  . MCV 12/31/2015 88.6  80.0 - 100.0 fL Final  . MCH 12/31/2015 29.4  27.0 - 33.0 pg Final  . MCHC 12/31/2015 33.1  32.0 - 36.0 g/dL Final  . RDW 12/31/2015 13.7  11.0 - 15.0 % Final  . Platelets 12/31/2015 297  140 - 400 K/uL Final  . MPV 12/31/2015 10.8  7.5 - 12.5 fL Final  . Neutro Abs 12/31/2015 8118* 1,500 - 7,800 cells/uL Final  . Lymphs Abs 12/31/2015 2829  850 - 3,900 cells/uL Final  . Monocytes Absolute  12/31/2015 984* 200 - 950 cells/uL Final  . Eosinophils Absolute 12/31/2015 246  15 - 500 cells/uL Final  . Basophils Absolute 12/31/2015 123  0 - 200 cells/uL Final  . Neutrophils Relative % 12/31/2015 66  % Final  . Lymphocytes Relative 12/31/2015 23  % Final  . Monocytes Relative 12/31/2015 8  % Final  . Eosinophils Relative 12/31/2015 2  % Final  . Basophils Relative 12/31/2015 1  % Final  . Smear Review 12/31/2015 Criteria for review not met   Final  . Sodium 12/31/2015 138  135 - 146 mmol/L Final  . Potassium 12/31/2015 4.3  3.5 - 5.3 mmol/L Final  . Chloride 12/31/2015 99  98 - 110 mmol/L Final  . CO2 12/31/2015 29  20 - 31 mmol/L Final  . Glucose, Bld 12/31/2015 142* 65 - 99 mg/dL Final  . BUN 12/31/2015 19  7 - 25 mg/dL Final  . Creat 12/31/2015 1.08  0.70 - 1.25 mg/dL Final   Comment:   For patients > or = 64 years of age: The upper reference limit for Creatinine is approximately 13% higher for people identified as African-American.     . Total Bilirubin  12/31/2015 0.3  0.2 - 1.2 mg/dL Final  . Alkaline Phosphatase 12/31/2015 41  40 - 115 U/L Final  . AST 12/31/2015 12  10 - 35 U/L Final  . ALT 12/31/2015 19  9 - 46 U/L Final  . Total Protein 12/31/2015 6.1  6.1 - 8.1 g/dL Final  . Albumin 12/31/2015 4.1  3.6 - 5.1 g/dL Final  . Calcium 12/31/2015 8.8  8.6 - 10.3 mg/dL Final  . GFR, Est African American 12/31/2015 84  >=60 mL/min Final  . GFR, Est Non African American 12/31/2015 73  >=60 mL/min Final      Imaging: No results found.  Speciality Comments: No specialty comments available.    Procedures:  No procedures performed Allergies: Patient has no known allergies.   Assessment / Plan:     Visit Diagnoses: Autoimmune disease ([place]) - doing well. - Plan: CBC with Differential/Platelet, COMPLETE METABOLIC PANEL WITH GFR, Urinalysis, Routine w reflex microscopic  Ds DNA antibody positive  ANA positive  High risk medication use - PLQ-200 mg twice a day started August 2017  - Plan: CBC with Differential/Platelet, COMPLETE METABOLIC PANEL WITH GFR, Urinalysis, Routine w reflex microscopic  Neck pain  Photosensitivity  Acute pain of left shoulder   Plan: #1: Autoimmune disease. Doing well.  High risk prescription. Plaquenil 20 mg twice a day. We had a long and thorough discussion on the side effects of Plaquenil, Plaquenil eye exam, proper way of taking Plaquenil. Patient is in agreement and understands.  #2: CBC with differential, CMP with GFR, urinalysis today.  #3: Return to clinic in 5 months.  #4: CBC with differential, CMP with GFR due 3 months from today. After that we will do it every 4 months. Orders: Orders Placed This Encounter  Procedures  . CBC with Differential/Platelet  . COMPLETE METABOLIC PANEL WITH GFR  . Urinalysis, Routine w reflex microscopic   Meds ordered this encounter  Medications  . hydroxychloroquine (PLAQUENIL) 200 MG tablet    Sig: Take 1 tablet (200 mg total) by mouth 2 (two)  times daily.    Dispense:  180 tablet    Refill:  1    Order Specific Question:   Supervising Provider    Answer:   Bo Merino [phone]    Face-to-face time spent with patient was  30 minutes. 50% of time was spent in counseling and coordination of care.  Follow-Up Instructions: No Follow-up on file.   Eliezer Lofts, PA-C  Note - This record has been created using [employer].  Chart creation errors have been sought, but may not always  have been located. Such creation errors do not reflect on  the standard of medical care.

## 2016-04-01 LAB — URINALYSIS, ROUTINE W REFLEX MICROSCOPIC
BILIRUBIN URINE: NEGATIVE
GLUCOSE, UA: NEGATIVE
HGB URINE DIPSTICK: NEGATIVE
Ketones, ur: NEGATIVE
Leukocytes, UA: NEGATIVE
Nitrite: NEGATIVE
Protein, ur: NEGATIVE
Specific Gravity, Urine: 1.018 (ref 1.001–1.035)
pH: 5.5 (ref 5.0–8.0)

## 2016-04-20 DIAGNOSIS — M159 Polyosteoarthritis, unspecified: Secondary | ICD-10-CM | POA: Diagnosis not present

## 2016-04-20 DIAGNOSIS — E119 Type 2 diabetes mellitus without complications: Secondary | ICD-10-CM | POA: Diagnosis not present

## 2016-04-20 DIAGNOSIS — I10 Essential (primary) hypertension: Secondary | ICD-10-CM | POA: Diagnosis not present

## 2016-04-29 DIAGNOSIS — M329 Systemic lupus erythematosus, unspecified: Secondary | ICD-10-CM | POA: Diagnosis not present

## 2016-04-29 DIAGNOSIS — M4722 Other spondylosis with radiculopathy, cervical region: Secondary | ICD-10-CM | POA: Diagnosis not present

## 2016-04-29 DIAGNOSIS — M7522 Bicipital tendinitis, left shoulder: Secondary | ICD-10-CM | POA: Diagnosis not present

## 2016-05-11 DIAGNOSIS — Z79899 Other long term (current) drug therapy: Secondary | ICD-10-CM | POA: Diagnosis not present

## 2016-05-18 DIAGNOSIS — E119 Type 2 diabetes mellitus without complications: Secondary | ICD-10-CM | POA: Diagnosis not present

## 2016-05-18 DIAGNOSIS — M159 Polyosteoarthritis, unspecified: Secondary | ICD-10-CM | POA: Diagnosis not present

## 2016-05-18 DIAGNOSIS — I10 Essential (primary) hypertension: Secondary | ICD-10-CM | POA: Diagnosis not present

## 2016-06-09 DIAGNOSIS — Z6832 Body mass index (BMI) 32.0-32.9, adult: Secondary | ICD-10-CM | POA: Diagnosis not present

## 2016-06-09 DIAGNOSIS — E7800 Pure hypercholesterolemia, unspecified: Secondary | ICD-10-CM | POA: Diagnosis not present

## 2016-06-09 DIAGNOSIS — M329 Systemic lupus erythematosus, unspecified: Secondary | ICD-10-CM | POA: Diagnosis not present

## 2016-06-09 DIAGNOSIS — Z299 Encounter for prophylactic measures, unspecified: Secondary | ICD-10-CM | POA: Diagnosis not present

## 2016-06-09 DIAGNOSIS — E1165 Type 2 diabetes mellitus with hyperglycemia: Secondary | ICD-10-CM | POA: Diagnosis not present

## 2016-06-09 DIAGNOSIS — G8929 Other chronic pain: Secondary | ICD-10-CM | POA: Diagnosis not present

## 2016-06-09 DIAGNOSIS — Z79899 Other long term (current) drug therapy: Secondary | ICD-10-CM | POA: Diagnosis not present

## 2016-06-09 DIAGNOSIS — I10 Essential (primary) hypertension: Secondary | ICD-10-CM | POA: Diagnosis not present

## 2016-06-09 DIAGNOSIS — K219 Gastro-esophageal reflux disease without esophagitis: Secondary | ICD-10-CM | POA: Diagnosis not present

## 2016-06-09 DIAGNOSIS — G5600 Carpal tunnel syndrome, unspecified upper limb: Secondary | ICD-10-CM | POA: Diagnosis not present

## 2016-06-14 DIAGNOSIS — E119 Type 2 diabetes mellitus without complications: Secondary | ICD-10-CM | POA: Diagnosis not present

## 2016-06-14 DIAGNOSIS — M159 Polyosteoarthritis, unspecified: Secondary | ICD-10-CM | POA: Diagnosis not present

## 2016-06-14 DIAGNOSIS — I10 Essential (primary) hypertension: Secondary | ICD-10-CM | POA: Diagnosis not present

## 2016-08-10 DIAGNOSIS — M159 Polyosteoarthritis, unspecified: Secondary | ICD-10-CM | POA: Diagnosis not present

## 2016-08-10 DIAGNOSIS — E119 Type 2 diabetes mellitus without complications: Secondary | ICD-10-CM | POA: Diagnosis not present

## 2016-08-10 DIAGNOSIS — I10 Essential (primary) hypertension: Secondary | ICD-10-CM | POA: Diagnosis not present

## 2016-08-25 DIAGNOSIS — I10 Essential (primary) hypertension: Secondary | ICD-10-CM | POA: Diagnosis not present

## 2016-08-25 DIAGNOSIS — E119 Type 2 diabetes mellitus without complications: Secondary | ICD-10-CM | POA: Diagnosis not present

## 2016-08-25 DIAGNOSIS — M159 Polyosteoarthritis, unspecified: Secondary | ICD-10-CM | POA: Diagnosis not present

## 2016-08-31 DIAGNOSIS — Z713 Dietary counseling and surveillance: Secondary | ICD-10-CM | POA: Diagnosis not present

## 2016-08-31 DIAGNOSIS — K219 Gastro-esophageal reflux disease without esophagitis: Secondary | ICD-10-CM | POA: Diagnosis not present

## 2016-08-31 DIAGNOSIS — Z6833 Body mass index (BMI) 33.0-33.9, adult: Secondary | ICD-10-CM | POA: Diagnosis not present

## 2016-08-31 DIAGNOSIS — Z299 Encounter for prophylactic measures, unspecified: Secondary | ICD-10-CM | POA: Diagnosis not present

## 2016-08-31 DIAGNOSIS — E1165 Type 2 diabetes mellitus with hyperglycemia: Secondary | ICD-10-CM | POA: Diagnosis not present

## 2016-08-31 DIAGNOSIS — E7800 Pure hypercholesterolemia, unspecified: Secondary | ICD-10-CM | POA: Diagnosis not present

## 2016-08-31 DIAGNOSIS — I10 Essential (primary) hypertension: Secondary | ICD-10-CM | POA: Diagnosis not present

## 2016-08-31 DIAGNOSIS — G8929 Other chronic pain: Secondary | ICD-10-CM | POA: Diagnosis not present

## 2016-08-31 DIAGNOSIS — M329 Systemic lupus erythematosus, unspecified: Secondary | ICD-10-CM | POA: Diagnosis not present

## 2016-09-01 ENCOUNTER — Encounter: Payer: Self-pay | Admitting: Rheumatology

## 2016-09-01 ENCOUNTER — Ambulatory Visit (INDEPENDENT_AMBULATORY_CARE_PROVIDER_SITE_OTHER): Payer: Medicare Other | Admitting: Rheumatology

## 2016-09-01 ENCOUNTER — Telehealth: Payer: Self-pay | Admitting: Rheumatology

## 2016-09-01 VITALS — BP 114/69 | HR 86 | Resp 14 | Ht 67.0 in | Wt 214.0 lb

## 2016-09-01 DIAGNOSIS — M25512 Pain in left shoulder: Secondary | ICD-10-CM | POA: Diagnosis not present

## 2016-09-01 DIAGNOSIS — M542 Cervicalgia: Secondary | ICD-10-CM

## 2016-09-01 DIAGNOSIS — D8989 Other specified disorders involving the immune mechanism, not elsewhere classified: Secondary | ICD-10-CM | POA: Diagnosis not present

## 2016-09-01 DIAGNOSIS — Z79899 Other long term (current) drug therapy: Secondary | ICD-10-CM

## 2016-09-01 DIAGNOSIS — G8929 Other chronic pain: Secondary | ICD-10-CM

## 2016-09-01 DIAGNOSIS — M359 Systemic involvement of connective tissue, unspecified: Secondary | ICD-10-CM

## 2016-09-01 LAB — COMPLETE METABOLIC PANEL WITH GFR
ALT: 14 U/L (ref 9–46)
AST: 15 U/L (ref 10–35)
Albumin: 4.2 g/dL (ref 3.6–5.1)
Alkaline Phosphatase: 46 U/L (ref 40–115)
BUN: 13 mg/dL (ref 7–25)
CALCIUM: 9.4 mg/dL (ref 8.6–10.3)
CO2: 28 mmol/L (ref 20–32)
Chloride: 101 mmol/L (ref 98–110)
Creat: 0.92 mg/dL (ref 0.70–1.25)
GFR, Est Non African American: 88 mL/min (ref >=60)
Glucose, Bld: 96 mg/dL (ref 65–99)
Potassium: 4.9 mmol/L (ref 3.5–5.3)
Sodium: 140 mmol/L (ref 135–146)
TOTAL PROTEIN: 6.3 g/dL (ref 6.1–8.1)
Total Bilirubin: 0.4 mg/dL (ref 0.2–1.2)

## 2016-09-01 LAB — CBC WITH DIFFERENTIAL/PLATELET
BASOS PCT: 1 %
Basophils Absolute: 82 cells/uL (ref 0–200)
Eosinophils Absolute: 246 cells/uL (ref 15–500)
Eosinophils Relative: 3 %
HCT: 46.2 % (ref 38.5–50.0)
Hemoglobin: 15.4 g/dL (ref 13.2–17.1)
LYMPHS PCT: 28 %
Lymphs Abs: 2296 cells/uL (ref 850–3900)
MCH: 30.1 pg (ref 27.0–33.0)
MCHC: 33.3 g/dL (ref 32.0–36.0)
MCV: 90.4 fL (ref 80.0–100.0)
MONOS PCT: 7 %
MPV: 10.9 fL (ref 7.5–12.5)
Monocytes Absolute: 574 cells/uL (ref 200–950)
Neutro Abs: 5002 cells/uL (ref 1500–7800)
Neutrophils Relative %: 61 %
Platelets: 290 10*3/uL (ref 140–400)
RBC: 5.11 MIL/uL (ref 4.20–5.80)
RDW: 13.4 % (ref 11.0–15.0)
WBC: 8.2 10*3/uL (ref 3.8–10.8)

## 2016-09-01 MED ORDER — HYDROXYCHLOROQUINE SULFATE 200 MG PO TABS: 200.0000 mg | ORAL_TABLET | Freq: Two times a day (BID) | ORAL | 1 refills | Status: AC

## 2016-09-01 NOTE — Telephone Encounter (Signed)
Patient is requesting lab results from todays visit to be sent to Dr. Manuella Ghazi in [place]. Thanks!

## 2016-09-01 NOTE — Progress Notes (Signed)
Office Visit Note  Patient: Daryl Bell             Date of Birth: Jan 20, 1951           MRN: [id number]             PCP: Monico Blitz, MD Referring: Monico Blitz, MD Visit Date: 09/01/2016 Occupation: _0 @    Subjective:  No chief complaint on file.   History of Present Illness: Daryl Bell is a 66 y.o. male  Was last seen in our office on 03/31/2016 for autoimmune disease.  Patient states today that he is doing well with his autoimmune disease. He's been taking his medications as prescribed (Plaquenil 200 mg twice a day 7 days a week) ==> Patient is compliant with his medication and doing well with the Plaquenil 200 mg twice a day every day. Is noticed considerable improvement in his joints. His arthralgia is very well controlled. He is tolerating the medication well. It is affordable. No oral ulcers, no nasal ulcers. No fatigue. No lupus rash. No other complaints at this time.  Patient has a fair amount of pain and he gets tramadol from his PCP, Dr. Manuella Ghazi, at [facility].  He states that he takes tramadol 50 mg, 2 pills 4 times a day. 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 He also has some ongoing neck pain.  Activities of Daily Living:  Patient reports morning stiffness for 30 minutes.   Patient Reports nocturnal pain.  Difficulty dressing/grooming: Denies Difficulty climbing stairs: Denies Difficulty getting out of chair: Denies Difficulty using hands for taps, buttons, cutlery, and/or writing: Denies   Review of Systems  Constitutional: Negative for fatigue.  HENT: Negative for mouth sores and mouth dryness.   Eyes: Negative for dryness.  Respiratory: Negative for shortness of breath.   Gastrointestinal: Negative for constipation and diarrhea.  Musculoskeletal: Negative for myalgias and myalgias.  Skin: Negative for sensitivity to sunlight.  Neurological: Negative for memory loss.    Psychiatric/Behavioral: Negative for sleep disturbance.    PMFS History:  Patient Active Problem List   Diagnosis Date Noted  . Neck pain 03/31/2016  . Acute pain of left shoulder 03/31/2016  . ANA positive 12/29/2015  . 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 Photosensitivity 12/29/2015  . High risk medication use 12/29/2015  . Other dysphagia 02/13/2013  . Encounter for screening colonoscopy 02/13/2013    Past Medical History:  Diagnosis Date  . Arthritis   . Diabetes ([place])   . GERD (gastroesophageal reflux disease)   . Hypercholesterolemia   . Hypertension   . Kidney calculus     Family History  Problem Relation Age of Onset  . Colon cancer Neg Hx    Past Surgical History:  Procedure Laterality Date  . COLONOSCOPY, ESOPHAGOGASTRODUODENOSCOPY (EGD) AND ESOPHAGEAL DILATION N/A 03/07/2013   Procedure: COLONOSCOPY, ESOPHAGOGASTRODUODENOSCOPY (EGD) AND ESOPHAGEAL DILATION;  Surgeon: Daneil Dolin, MD;  [facility];  Service: Endoscopy;  Laterality: N/A;  11:15  . ESOPHAGOGASTRODUODENOSCOPY     remote past in the 1990s with dilation  . KIDNEY STONE SURGERY  2011   Social History   Social History Narrative  . No narrative on file     Objective: Vital Signs: BP 114/69   Pulse 86   Resp 14   Ht 5' 7" (1.702 m)   Wt 214 lb (97.1 kg)   BMI 33.52 kg/m    Physical Exam  Constitutional: He is oriented to person, place, and time. He appears well-developed and well-nourished.  HENT:  Head: Normocephalic and atraumatic.  Eyes: Pupils are equal, round, and reactive to light. Conjunctivae and EOM are normal.  Neck: Normal range of motion. Neck supple.  Cardiovascular: Normal rate, regular rhythm and normal heart sounds.  Exam reveals no gallop and no friction rub.   No murmur heard. Pulmonary/Chest: Effort normal and breath sounds normal. No respiratory distress. He has no wheezes. He has no rales. He exhibits no tenderness.  Abdominal: Soft. He exhibits no  distension and no mass. There is no tenderness. There is no guarding.  Musculoskeletal: Normal range of motion.  Lymphadenopathy:    He has no cervical adenopathy.  Neurological: He is alert and oriented to person, place, and time. He exhibits normal muscle tone. Coordination normal.  Skin: Skin is warm and dry. Capillary refill takes less than 2 seconds. No rash noted.  Psychiatric: He has a normal mood and affect. His behavior is normal. Judgment and thought content normal.  Vitals reviewed.    Musculoskeletal Exam:  Full range of motion of all joints Grip strength is equal and strong bilaterally Fibromyalgia tender points are absent  CDAI Exam: CDAI Homunculus Exam:   Joint Counts:  CDAI Tender Joint count: 0 CDAI Swollen Joint count: 0  No synovitis on examination   Investigation: No additional findings.  Office Visit on 03/31/2016  Component Date Value Ref Range Status  . WBC 03/31/2016 13.6* 3.8 - 10.8 K/uL Final  . RBC 03/31/2016 4.84  4.20 - 5.80 MIL/uL Final  . Hemoglobin 03/31/2016 14.8  13.2 - 17.1 g/dL Final  . HCT 03/31/2016 44.3  38.5 - 50.0 % Final  . MCV 03/31/2016 91.5  80.0 - 100.0 fL Final  . MCH 03/31/2016 30.6  27.0 - 33.0 pg Final  . MCHC 03/31/2016 33.4  32.0 - 36.0 g/dL Final  . RDW 03/31/2016 13.4  11.0 - 15.0 % Final  . Platelets 03/31/2016 316  140 - 400 K/uL Final  . MPV 03/31/2016 10.4  7.5 - 12.5 fL Final  . Neutro Abs 03/31/2016 7888* 1,500 - 7,800 cells/uL Final  . Lymphs Abs 03/31/2016 3944* 850 - 3,900 cells/uL Final  . Monocytes Absolute 03/31/2016 1224* 200 - 950 cells/uL Final  . Eosinophils Absolute 03/31/2016 408  15 - 500 cells/uL Final  . Basophils Absolute 03/31/2016 136  0 - 200 cells/uL Final  . Neutrophils Relative % 03/31/2016 58  % Final  . Lymphocytes Relative 03/31/2016 29  % Final  . Monocytes Relative 03/31/2016 9  % Final  . Eosinophils Relative 03/31/2016 3  % Final  . Basophils Relative 03/31/2016 1  % Final  .  Smear Review 03/31/2016 Criteria for review not met   Final  . Sodium 03/31/2016 136  135 - 146 mmol/L Final  . Potassium 03/31/2016 4.5  3.5 - 5.3 mmol/L Final  . Chloride 03/31/2016 94* 98 - 110 mmol/L Final  . CO2 03/31/2016 28  20 - 31 mmol/L Final  . Glucose, Bld 03/31/2016 136* 65 - 99 mg/dL Final  . BUN 03/31/2016 18  7 - 25 mg/dL Final  . Creat 03/31/2016 1.12  0.70 - 1.25 mg/dL Final   Comment:   For patients > or = 64 years of age: The upper reference limit for Creatinine is approximately 13% higher for people identified as African-American.     . Total Bilirubin 03/31/2016 0.4  0.2 - 1.2 mg/dL Final  . Alkaline  Phosphatase 03/31/2016 43  40 - 115 U/L Final  . AST 03/31/2016 12  10 - 35 U/L Final  . ALT 03/31/2016 17  9 - 46 U/L Final  . Total Protein 03/31/2016 6.4  6.1 - 8.1 g/dL Final  . Albumin 03/31/2016 4.3  3.6 - 5.1 g/dL Final  . Calcium 03/31/2016 9.4  8.6 - 10.3 mg/dL Final  . GFR, Est African American 03/31/2016 80  >=60 mL/min Final  . GFR, Est Non African American 03/31/2016 70  >=60 mL/min Final  . Color, Urine 03/31/2016 YELLOW  YELLOW Final  . APPearance 03/31/2016 CLEAR  CLEAR Final  . Specific Gravity, Urine 03/31/2016 1.018  1.001 - 1.035 Final  . pH 03/31/2016 5.5  5.0 - 8.0 Final  . Glucose, UA 03/31/2016 NEGATIVE  NEGATIVE Final  . Bilirubin Urine 03/31/2016 NEGATIVE  NEGATIVE Final  . Ketones, ur 03/31/2016 NEGATIVE  NEGATIVE Final  . Hgb urine dipstick 03/31/2016 NEGATIVE  NEGATIVE Final  . Protein, ur 03/31/2016 NEGATIVE  NEGATIVE Final  . Nitrite 03/31/2016 NEGATIVE  NEGATIVE Final  . Leukocytes, UA 03/31/2016 NEGATIVE  NEGATIVE Final     Imaging: No results found.  Speciality Comments: No specialty comments available.    Procedures:  No procedures performed Allergies: Patient has no known allergies.   Assessment / Plan:     Visit Diagnoses: Autoimmune disease ([place]) - Positive ANA, positive double-stranded DNA - Plan: CBC with  Differential/Platelet, COMPLETE METABOLIC PANEL WITH GFR, Urinalysis, Routine w reflex microscopic  High risk medication use - Plan: CBC with Differential/Platelet, COMPLETE METABOLIC PANEL WITH GFR, Urinalysis, Routine w reflex microscopic  Neck pain  Chronic left shoulder pain    #1: Autoimmune disease. Doing well.No flare. No oral or nasal ulcers. No additional fatigue. No new rashes.  #2:High risk prescription. Plaquenil 200 mg twice a day. We had a long and thorough discussion on the side effects of Plaquenil,  Plaquenil eye exam, proper way of taking Plaquenil. Patient is in agreement and understands.  #3:CBC W/ DIFF, CMP W/ GFR, URINALYSIS TODAY  #4: Patient is on pain control with tramadol through Dr. Trena Platt office in in [facility]; he takes 2 pills 4 times a day. I advised him to consider trying 3 times a day every other day for the next few weeks and see if he tolerates that. If so, that would be better.  #5: I advised patient to wear long sleeve shirts when he is outdoors. He does not like to put on sunscreen he just uses aloe vera lotion. The long sleeve shirts will protect his skin from damage since he has lupus.  Orders: Orders Placed This Encounter  Procedures  . CBC with Differential/Platelet  . COMPLETE METABOLIC PANEL WITH GFR  . Urinalysis, Routine w reflex microscopic   Meds ordered this encounter  Medications  . hydroxychloroquine (PLAQUENIL) 200 MG tablet    Sig: Take 1 tablet (200 mg total) by mouth 2 (two) times daily.    Dispense:  180 tablet    Refill:  1    Order Specific Question:   Supervising Provider    Answer:   Bo Merino [phone]    Face-to-face time spent with patient was 30 minutes. 50% of time was spent in counseling and coordination of care.  Follow-Up Instructions: Return in about 5 months (around 02/01/2017) for sle,plq 200 bid 7d/wk, .   Eliezer Lofts, PA-C I examined and evaluated the patient with Eliezer Lofts  PA.Patient had no rash or synovitis on my  exam today. Use of sunscreen discussed. The plan of care was discussed as noted above.  Bo Merino, MD Note - This record has been created using Editor, commissioning.  Chart creation errors have been sought, but may not always  have been located. Such creation errors do not reflect on  the standard of medical care.

## 2016-09-02 LAB — URINALYSIS, ROUTINE W REFLEX MICROSCOPIC
Bilirubin Urine: NEGATIVE
Glucose, UA: NEGATIVE
Hgb urine dipstick: NEGATIVE
Ketones, ur: NEGATIVE
LEUKOCYTES UA: NEGATIVE
Nitrite: NEGATIVE
PH: 7.5 (ref 5.0–8.0)
Protein, ur: NEGATIVE
Specific Gravity, Urine: 1.018 (ref 1.001–1.035)

## 2016-09-22 DIAGNOSIS — Z7189 Other specified counseling: Secondary | ICD-10-CM | POA: Diagnosis not present

## 2016-09-22 DIAGNOSIS — I10 Essential (primary) hypertension: Secondary | ICD-10-CM | POA: Diagnosis not present

## 2016-09-22 DIAGNOSIS — E1165 Type 2 diabetes mellitus with hyperglycemia: Secondary | ICD-10-CM | POA: Diagnosis not present

## 2016-09-22 DIAGNOSIS — M329 Systemic lupus erythematosus, unspecified: Secondary | ICD-10-CM | POA: Diagnosis not present

## 2016-09-22 DIAGNOSIS — Z6832 Body mass index (BMI) 32.0-32.9, adult: Secondary | ICD-10-CM | POA: Diagnosis not present

## 2016-09-22 DIAGNOSIS — Z0000 Encounter for general adult medical examination without abnormal findings: Secondary | ICD-10-CM | POA: Diagnosis not present

## 2016-09-22 DIAGNOSIS — Z1211 Encounter for screening for malignant neoplasm of colon: Secondary | ICD-10-CM | POA: Diagnosis not present

## 2016-09-22 DIAGNOSIS — Z299 Encounter for prophylactic measures, unspecified: Secondary | ICD-10-CM | POA: Diagnosis not present

## 2016-09-22 DIAGNOSIS — E7800 Pure hypercholesterolemia, unspecified: Secondary | ICD-10-CM | POA: Diagnosis not present

## 2016-09-22 DIAGNOSIS — G8929 Other chronic pain: Secondary | ICD-10-CM | POA: Diagnosis not present

## 2016-09-22 DIAGNOSIS — Z1389 Encounter for screening for other disorder: Secondary | ICD-10-CM | POA: Diagnosis not present

## 2016-09-22 DIAGNOSIS — R5383 Other fatigue: Secondary | ICD-10-CM | POA: Diagnosis not present

## 2016-09-28 DIAGNOSIS — E119 Type 2 diabetes mellitus without complications: Secondary | ICD-10-CM | POA: Diagnosis not present

## 2016-09-28 DIAGNOSIS — I10 Essential (primary) hypertension: Secondary | ICD-10-CM | POA: Diagnosis not present

## 2016-09-28 DIAGNOSIS — M159 Polyosteoarthritis, unspecified: Secondary | ICD-10-CM | POA: Diagnosis not present

## 2016-10-25 DIAGNOSIS — E119 Type 2 diabetes mellitus without complications: Secondary | ICD-10-CM | POA: Diagnosis not present

## 2016-10-25 DIAGNOSIS — M159 Polyosteoarthritis, unspecified: Secondary | ICD-10-CM | POA: Diagnosis not present

## 2016-10-25 DIAGNOSIS — I10 Essential (primary) hypertension: Secondary | ICD-10-CM | POA: Diagnosis not present

## 2016-11-30 DIAGNOSIS — G8929 Other chronic pain: Secondary | ICD-10-CM | POA: Diagnosis not present

## 2016-11-30 DIAGNOSIS — M329 Systemic lupus erythematosus, unspecified: Secondary | ICD-10-CM | POA: Diagnosis not present

## 2016-11-30 DIAGNOSIS — Z299 Encounter for prophylactic measures, unspecified: Secondary | ICD-10-CM | POA: Diagnosis not present

## 2016-11-30 DIAGNOSIS — Z2821 Immunization not carried out because of patient refusal: Secondary | ICD-10-CM | POA: Diagnosis not present

## 2016-11-30 DIAGNOSIS — Z79899 Other long term (current) drug therapy: Secondary | ICD-10-CM | POA: Diagnosis not present

## 2016-11-30 DIAGNOSIS — Z6832 Body mass index (BMI) 32.0-32.9, adult: Secondary | ICD-10-CM | POA: Diagnosis not present

## 2016-11-30 DIAGNOSIS — G4700 Insomnia, unspecified: Secondary | ICD-10-CM | POA: Diagnosis not present

## 2016-12-09 DIAGNOSIS — I10 Essential (primary) hypertension: Secondary | ICD-10-CM | POA: Diagnosis not present

## 2016-12-09 DIAGNOSIS — M159 Polyosteoarthritis, unspecified: Secondary | ICD-10-CM | POA: Diagnosis not present

## 2016-12-09 DIAGNOSIS — E119 Type 2 diabetes mellitus without complications: Secondary | ICD-10-CM | POA: Diagnosis not present

## 2017-01-26 DIAGNOSIS — Z299 Encounter for prophylactic measures, unspecified: Secondary | ICD-10-CM | POA: Diagnosis not present

## 2017-01-26 DIAGNOSIS — I10 Essential (primary) hypertension: Secondary | ICD-10-CM | POA: Diagnosis not present

## 2017-01-26 DIAGNOSIS — Z6832 Body mass index (BMI) 32.0-32.9, adult: Secondary | ICD-10-CM | POA: Diagnosis not present

## 2017-01-26 DIAGNOSIS — E1165 Type 2 diabetes mellitus with hyperglycemia: Secondary | ICD-10-CM | POA: Diagnosis not present

## 2017-01-26 DIAGNOSIS — J40 Bronchitis, not specified as acute or chronic: Secondary | ICD-10-CM | POA: Diagnosis not present

## 2017-01-26 DIAGNOSIS — M329 Systemic lupus erythematosus, unspecified: Secondary | ICD-10-CM | POA: Diagnosis not present

## 2017-01-26 DIAGNOSIS — Z789 Other specified health status: Secondary | ICD-10-CM | POA: Diagnosis not present

## 2017-01-27 NOTE — Progress Notes (Signed)
Office Visit Note  Patient: Daryl Bell             Date of Birth: Jun 26, 1950           MRN: [id number]             PCP: Monico Blitz, MD Referring: Monico Blitz, MD Visit Date: 02/09/2017 Occupation: @GUAROCC @    Subjective:  Medication management   History of Present Illness: Daryl Bell is a 67 y.o. male with history of autoimmune disease. He states he has not had a recent rash. He continues to have some pain and stiffness in his hands. He denies any joint swelling. He states he had recent upper respiratory tract infection which was treated by Dr. Manuella Ghazi. His infection is cleared up.  Activities of Daily Living:  Patient reports morning stiffness for 5 minutes.   Patient Denies nocturnal pain.  Difficulty dressing/grooming: Denies Difficulty climbing stairs: Denies Difficulty getting out of chair: Denies Difficulty using hands for taps, buttons, cutlery, and/or writing: Denies   Review of Systems  Constitutional: Negative for fatigue, night sweats and weakness ( ).  HENT: Positive for mouth dryness. Negative for mouth sores and nose dryness.   Eyes: Negative for redness and dryness.  Respiratory: Negative for shortness of breath and difficulty breathing.   Cardiovascular: Positive for hypertension. Negative for chest pain, palpitations, irregular heartbeat and swelling in legs/feet.  Gastrointestinal: Negative for constipation and diarrhea.  Endocrine: Negative for increased urination.  Musculoskeletal: Positive for arthralgias, joint pain and morning stiffness. Negative for joint swelling, myalgias, muscle weakness, muscle tenderness and myalgias.  Skin: Negative for color change, rash, hair loss, nodules/bumps, skin tightness, ulcers and sensitivity to sunlight.  Allergic/Immunologic: Negative for susceptible to infections.  Neurological: Negative for dizziness, fainting, memory loss and night sweats.  Hematological: Negative for swollen glands.    Psychiatric/Behavioral: Positive for sleep disturbance. Negative for depressed mood. The patient is not nervous/anxious.        Better with supplements    PMFS History:  Patient Active Problem List   Diagnosis Date Noted  . Neck pain 03/31/2016  . Acute pain of left shoulder 03/31/2016  . ANA positive 12/29/2015  . 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 Photosensitivity 12/29/2015  . High risk medication use 12/29/2015  . Other dysphagia 02/13/2013  . Encounter for screening colonoscopy 02/13/2013    Past Medical History:  Diagnosis Date  . Arthritis   . Diabetes ([place])   . GERD (gastroesophageal reflux disease)   . Hypercholesterolemia   . Hypertension   . Kidney calculus     Family History  Problem Relation Age of Onset  . Dementia Father   . Colon cancer Neg Hx    Past Surgical History:  Procedure Laterality Date  . COLONOSCOPY, ESOPHAGOGASTRODUODENOSCOPY (EGD) AND ESOPHAGEAL DILATION N/A 03/07/2013   Procedure: COLONOSCOPY, ESOPHAGOGASTRODUODENOSCOPY (EGD) AND ESOPHAGEAL DILATION;  Surgeon: Daneil Dolin, MD;  Location[facility];  Service: Endoscopy;  Laterality: N/A;  11:15  . ESOPHAGOGASTRODUODENOSCOPY     remote past in the 1990s with dilation  . KIDNEY STONE SURGERY  2011   Social History   Social History Narrative  . Not on file     Objective: Vital Signs: BP 121/76 (BP Location: Left Arm, Patient Position: Sitting, Cuff Size: Normal)   Pulse 82   Resp 17   Ht 5\' 7"  (1.702 m)   Wt 210 lb (95.3 kg)   BMI 32.89 kg/m    Physical Exam  Constitutional: He is oriented to person, place, and time. He appears well-developed and well-nourished.  HENT:  Head: Normocephalic and atraumatic.  Eyes: Conjunctivae and EOM are normal. Pupils are equal, round, and reactive to light.  Neck: Normal range of motion. Neck supple.  Cardiovascular: Normal rate, regular rhythm and normal heart sounds.  Pulmonary/Chest: Effort normal and breath sounds normal.   Abdominal: Soft. Bowel sounds are normal.  Neurological: He is alert and oriented to person, place, and time.  Skin: Skin is warm and dry. Capillary refill takes less than 2 seconds.  Psychiatric: He has a normal mood and affect. His behavior is normal.  Nursing note and vitals reviewed.    Musculoskeletal Exam: C-spine and thoracic lumbar spine good range of motion. Shoulder joints elbow joints wrist joint MCPs PIPs DIPs with good range of motion. Hip joints knee joints ankles MTPs PIPs with good range of motion with no synovitis.  CDAI Exam: No CDAI exam completed.    Investigation: No additional findings.PLQ eye exam: 11/05/2015 CBC Latest Ref Rng & Units 09/01/2016 03/31/2016 12/31/2015  WBC 3.8 - 10.8 K/uL 8.2 13.6(H) 12.3(H)  Hemoglobin 13.2 - 17.1 g/dL 15.4 14.8 15.0  Hematocrit 38.5 - 50.0 % 46.2 44.3 45.3  Platelets 140 - 400 K/uL 290 316 297   CMP Latest Ref Rng & Units 09/01/2016 03/31/2016 12/31/2015  Glucose 65 - 99 mg/dL 96 136(H) 142(H)  BUN 7 - 25 mg/dL 13 18 19   Creatinine 0.70 - 1.25 mg/dL 0.92 1.12 1.08  Sodium 135 - 146 mmol/L 140 136 138  Potassium 3.5 - 5.3 mmol/L 4.9 4.5 4.3  Chloride 98 - 110 mmol/L 101 94(L) 99  CO2 20 - 32 mmol/L 28 28 29   Calcium 8.6 - 10.3 mg/dL 9.4 9.4 8.8  Total Protein 6.1 - 8.1 g/dL 6.3 6.4 6.1  Total Bilirubin 0.2 - 1.2 mg/dL 0.4 0.4 0.3  Alkaline Phos 40 - 115 U/L 46 43 41  AST 10 - 35 U/L 15 12 12   ALT 9 - 46 U/L 14 17 19     Imaging: No results found.  Speciality Comments: No specialty comments available.    Procedures:  No procedures performed Allergies: Patient has no known allergies.   Assessment / Plan:     Visit Diagnoses: Autoimmune disease ([place]) - Positive ANA, positive double-stranded DNA, history of photosensitivity - he has been doing well on Plaquenil. His fatigue is improved. He has not had any rash or synovitis. Plan: Urinalysis, Routine w reflex microscopic, Anti-DNA antibody, double-stranded, C3 and C4,  Sedimentation rate  High risk medication use - PLQ 200 mg by mouth twice a dayeye exam: 11/05/2015 - Plan: CBC with Differential/Platelet, COMPLETE METABOLIC PANEL WITH GFR I will obtain labs today. We will monitor labs every 5 months. He has not had eye exam since October 2017. Have advised to get eye exam as soon as possible.  Other fatigue - Plan: VITAMIN D 25 Hydroxy (Vit-D Deficiency, Fractures) I would like to keep his vitamin D between 43 and 47 has he has history of autoimmune disease.   History of hypertension: His blood pressure is well controlled.  Other medical problems are listed as follows:  History of diabetes mellitus  History of hypercholesterolemia  History of gastroesophageal reflux (GERD)  Vitiligo  Renal calcinosis  History of lithotripsy     Orders: Orders Placed This Encounter  Procedures  . CBC with Differential/Platelet  . COMPLETE METABOLIC PANEL WITH GFR  . Urinalysis, Routine w reflex microscopic  .  Anti-DNA antibody, double-stranded  . C3 and C4  . Sedimentation rate  . VITAMIN D 25 Hydroxy (Vit-D Deficiency, Fractures)   No orders of the defined types were placed in this encounter.   Face-to-face time spent with patient was 20 minutes. Greater than 50% of time was spent in counseling and coordination of care.  Follow-Up Instructions: Return in about 5 months (around 07/10/2017) for Autoimmune disease.   Bo Merino, MD  Note - This record has been created using Editor, commissioning.  Chart creation errors have been sought, but may not always  have been located. Such creation errors do not reflect on  the standard of medical care.

## 2017-02-09 ENCOUNTER — Encounter: Payer: Self-pay | Admitting: Rheumatology

## 2017-02-09 ENCOUNTER — Ambulatory Visit (INDEPENDENT_AMBULATORY_CARE_PROVIDER_SITE_OTHER): Payer: Medicare Other | Admitting: Rheumatology

## 2017-02-09 VITALS — BP 121/76 | HR 82 | Resp 17 | Ht 67.0 in | Wt 210.0 lb

## 2017-02-09 DIAGNOSIS — Z8639 Personal history of other endocrine, nutritional and metabolic disease: Secondary | ICD-10-CM | POA: Diagnosis not present

## 2017-02-09 DIAGNOSIS — N29 Other disorders of kidney and ureter in diseases classified elsewhere: Secondary | ICD-10-CM | POA: Diagnosis not present

## 2017-02-09 DIAGNOSIS — Z9889 Other specified postprocedural states: Secondary | ICD-10-CM | POA: Diagnosis not present

## 2017-02-09 DIAGNOSIS — Z8679 Personal history of other diseases of the circulatory system: Secondary | ICD-10-CM

## 2017-02-09 DIAGNOSIS — R5383 Other fatigue: Secondary | ICD-10-CM

## 2017-02-09 DIAGNOSIS — Z79899 Other long term (current) drug therapy: Secondary | ICD-10-CM | POA: Diagnosis not present

## 2017-02-09 DIAGNOSIS — D8989 Other specified disorders involving the immune mechanism, not elsewhere classified: Secondary | ICD-10-CM

## 2017-02-09 DIAGNOSIS — Z8719 Personal history of other diseases of the digestive system: Secondary | ICD-10-CM | POA: Diagnosis not present

## 2017-02-09 DIAGNOSIS — L80 Vitiligo: Secondary | ICD-10-CM | POA: Diagnosis not present

## 2017-02-09 DIAGNOSIS — M359 Systemic involvement of connective tissue, unspecified: Secondary | ICD-10-CM

## 2017-02-10 LAB — COMPLETE METABOLIC PANEL WITH GFR
AG Ratio: 2.4 (calc) (ref 1.0–2.5)
ALT: 12 U/L (ref 9–46)
AST: 13 U/L (ref 10–35)
Albumin: 4.8 g/dL (ref 3.6–5.1)
Alkaline phosphatase (APISO): 47 U/L (ref 40–115)
BUN: 15 mg/dL (ref 7–25)
CALCIUM: 9.5 mg/dL (ref 8.6–10.3)
CO2: 32 mmol/L (ref 20–32)
CREATININE: 0.91 mg/dL (ref 0.70–1.25)
Chloride: 98 mmol/L (ref 98–110)
GFR, EST NON AFRICAN AMERICAN: 89 mL/min/{1.73_m2} (ref >=60)
GFR, Est African American: 103 mL/min/{1.73_m2} (ref >=60)
Globulin: 2 g/dL (calc) (ref 1.9–3.7)
Glucose, Bld: 84 mg/dL (ref 65–99)
Potassium: 4.1 mmol/L (ref 3.5–5.3)
Sodium: 140 mmol/L (ref 135–146)
Total Bilirubin: 0.5 mg/dL (ref 0.2–1.2)
Total Protein: 6.8 g/dL (ref 6.1–8.1)

## 2017-02-10 LAB — URINALYSIS, ROUTINE W REFLEX MICROSCOPIC
BILIRUBIN URINE: NEGATIVE
Glucose, UA: NEGATIVE
Hgb urine dipstick: NEGATIVE
Ketones, ur: NEGATIVE
LEUKOCYTES UA: NEGATIVE
NITRITE: NEGATIVE
PROTEIN: NEGATIVE
SPECIFIC GRAVITY, URINE: 1.017 (ref 1.001–1.03)
pH: <=5 (ref 5.0–8.0)

## 2017-02-10 LAB — CBC WITH DIFFERENTIAL/PLATELET
Basophils Absolute: 95 cells/uL (ref 0–200)
Basophils Relative: 0.9 %
EOS PCT: 1.8 %
Eosinophils Absolute: 191 cells/uL (ref 15–500)
HEMATOCRIT: 46.1 % (ref 38.5–50.0)
Hemoglobin: 15.4 g/dL (ref 13.2–17.1)
Lymphs Abs: 2682 cells/uL (ref 850–3900)
MCH: 29.8 pg (ref 27.0–33.0)
MCHC: 33.4 g/dL (ref 32.0–36.0)
MCV: 89.2 fL (ref 80.0–100.0)
MPV: 11.3 fL (ref 7.5–12.5)
Monocytes Relative: 7.6 %
NEUTROS PCT: 64.4 %
Neutro Abs: 6826 cells/uL (ref 1500–7800)
PLATELETS: 329 10*3/uL (ref 140–400)
RBC: 5.17 10*6/uL (ref 4.20–5.80)
RDW: 12.1 % (ref 11.0–15.0)
TOTAL LYMPHOCYTE: 25.3 %
WBC mixed population: 806 cells/uL (ref 200–950)
WBC: 10.6 10*3/uL (ref 3.8–10.8)

## 2017-02-10 LAB — C3 AND C4
C3 COMPLEMENT: 136 mg/dL (ref 82–185)
C4 COMPLEMENT: 27 mg/dL (ref 15–53)

## 2017-02-10 LAB — VITAMIN D 25 HYDROXY (VIT D DEFICIENCY, FRACTURES): VIT D 25 HYDROXY: 43 ng/mL (ref 30–100)

## 2017-02-10 LAB — SEDIMENTATION RATE: Sed Rate: 6 mm/h (ref 0–20)

## 2017-02-10 LAB — ANTI-DNA ANTIBODY, DOUBLE-STRANDED: DS DNA AB: 14 [IU]/mL — AB

## 2017-02-13 NOTE — Progress Notes (Signed)
Labs are stable.

## 2017-02-15 ENCOUNTER — Telehealth: Payer: Self-pay | Admitting: Rheumatology

## 2017-02-15 NOTE — Telephone Encounter (Signed)
Patient left a voicemail returning your call.  CB# [phone]

## 2017-02-15 NOTE — Telephone Encounter (Signed)
Patient advised lab results are stable.  

## 2017-03-01 DIAGNOSIS — H40053 Ocular hypertension, bilateral: Secondary | ICD-10-CM | POA: Diagnosis not present

## 2017-03-09 DIAGNOSIS — E119 Type 2 diabetes mellitus without complications: Secondary | ICD-10-CM | POA: Diagnosis not present

## 2017-03-09 DIAGNOSIS — I10 Essential (primary) hypertension: Secondary | ICD-10-CM | POA: Diagnosis not present

## 2017-03-09 DIAGNOSIS — M159 Polyosteoarthritis, unspecified: Secondary | ICD-10-CM | POA: Diagnosis not present

## 2017-03-15 DIAGNOSIS — Z79899 Other long term (current) drug therapy: Secondary | ICD-10-CM | POA: Diagnosis not present

## 2017-04-03 DIAGNOSIS — M159 Polyosteoarthritis, unspecified: Secondary | ICD-10-CM | POA: Diagnosis not present

## 2017-04-03 DIAGNOSIS — E119 Type 2 diabetes mellitus without complications: Secondary | ICD-10-CM | POA: Diagnosis not present

## 2017-04-03 DIAGNOSIS — I10 Essential (primary) hypertension: Secondary | ICD-10-CM | POA: Diagnosis not present

## 2017-05-04 ENCOUNTER — Telehealth: Payer: Self-pay

## 2017-05-04 MED ORDER — HYDROXYCHLOROQUINE SULFATE 200 MG PO TABS: 200.0000 mg | ORAL_TABLET | Freq: Two times a day (BID) | ORAL | 0 refills | Status: DC

## 2017-05-04 NOTE — Telephone Encounter (Signed)
Refill request received via fax.   Last visit: 02/09/2017 Next visit: 08/17/2017 Labs: 02/09/2017 stable  Eye exam: 03/15/2017 WNL   Okay to refill per Dr. Estanislado Pandy.

## 2017-05-16 DIAGNOSIS — E119 Type 2 diabetes mellitus without complications: Secondary | ICD-10-CM | POA: Diagnosis not present

## 2017-05-16 DIAGNOSIS — M159 Polyosteoarthritis, unspecified: Secondary | ICD-10-CM | POA: Diagnosis not present

## 2017-05-16 DIAGNOSIS — I10 Essential (primary) hypertension: Secondary | ICD-10-CM | POA: Diagnosis not present

## 2017-05-18 DIAGNOSIS — M329 Systemic lupus erythematosus, unspecified: Secondary | ICD-10-CM | POA: Diagnosis not present

## 2017-05-18 DIAGNOSIS — M79603 Pain in arm, unspecified: Secondary | ICD-10-CM | POA: Diagnosis not present

## 2017-05-18 DIAGNOSIS — I10 Essential (primary) hypertension: Secondary | ICD-10-CM | POA: Diagnosis not present

## 2017-05-18 DIAGNOSIS — E1165 Type 2 diabetes mellitus with hyperglycemia: Secondary | ICD-10-CM | POA: Diagnosis not present

## 2017-05-18 DIAGNOSIS — Z299 Encounter for prophylactic measures, unspecified: Secondary | ICD-10-CM | POA: Diagnosis not present

## 2017-05-18 DIAGNOSIS — Z6832 Body mass index (BMI) 32.0-32.9, adult: Secondary | ICD-10-CM | POA: Diagnosis not present

## 2017-06-20 DIAGNOSIS — M159 Polyosteoarthritis, unspecified: Secondary | ICD-10-CM | POA: Diagnosis not present

## 2017-06-20 DIAGNOSIS — E119 Type 2 diabetes mellitus without complications: Secondary | ICD-10-CM | POA: Diagnosis not present

## 2017-06-20 DIAGNOSIS — I10 Essential (primary) hypertension: Secondary | ICD-10-CM | POA: Diagnosis not present

## 2017-06-30 DIAGNOSIS — I10 Essential (primary) hypertension: Secondary | ICD-10-CM | POA: Diagnosis not present

## 2017-06-30 DIAGNOSIS — E119 Type 2 diabetes mellitus without complications: Secondary | ICD-10-CM | POA: Diagnosis not present

## 2017-06-30 DIAGNOSIS — M159 Polyosteoarthritis, unspecified: Secondary | ICD-10-CM | POA: Diagnosis not present

## 2017-07-25 DIAGNOSIS — I10 Essential (primary) hypertension: Secondary | ICD-10-CM | POA: Diagnosis not present

## 2017-07-25 DIAGNOSIS — E119 Type 2 diabetes mellitus without complications: Secondary | ICD-10-CM | POA: Diagnosis not present

## 2017-07-25 DIAGNOSIS — M159 Polyosteoarthritis, unspecified: Secondary | ICD-10-CM | POA: Diagnosis not present

## 2017-08-04 NOTE — Progress Notes (Signed)
Office Visit Note  Patient: Daryl Bell             Date of Birth: 11-15-1950           MRN: [id number]             PCP: Monico Blitz, MD Referring: Monico Blitz, MD Visit Date: 08/17/2017 Occupation: @GUAROCC @  Subjective:  Right hip pain.   History of Present Illness: Daryl Bell is a 67 y.o. male with history of autoimmune disease.  He states he has been having discomfort in his right hip.  Sometimes when he gets up he feels like his right leg gives out on him.  He also has discomfort when he walks.  He has been having discomfort in his left shoulder.  He states that he had left shoulder joint injection about a year ago which lasted until now.  Activities of Daily Living:  Patient reports morning stiffness for 5 minute.   Patient Denies nocturnal pain.  Difficulty dressing/grooming: Denies Difficulty climbing stairs: Denies Difficulty getting out of chair: Denies Difficulty using hands for taps, buttons, cutlery, and/or writing: Denies  Review of Systems  Constitutional: Negative for fatigue and night sweats.  HENT: Positive for mouth dryness. Negative for mouth sores and nose dryness.   Eyes: Negative for redness and dryness.  Respiratory: Negative for shortness of breath and difficulty breathing.   Cardiovascular: Negative for chest pain, palpitations, hypertension, irregular heartbeat and swelling in legs/feet.  Gastrointestinal: Negative for constipation and diarrhea.  Endocrine: Negative for increased urination.  Musculoskeletal: Positive for arthralgias, joint pain and morning stiffness. Negative for joint swelling, myalgias, muscle weakness, muscle tenderness and myalgias.  Skin: Positive for sensitivity to sunlight. Negative for color change, rash, hair loss, nodules/bumps, skin tightness and ulcers.  Allergic/Immunologic: Negative for susceptible to infections.  Neurological: Negative for dizziness, fainting, memory loss, night sweats and weakness ( ).   Hematological: Negative for swollen glands.  Psychiatric/Behavioral: Positive for sleep disturbance. Negative for depressed mood. The patient is not nervous/anxious.     PMFS History:  Patient Active Problem List   Diagnosis Date Noted  . Neck pain 03/31/2016  . Acute pain of left shoulder 03/31/2016  . ANA positive 12/29/2015  . 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 Photosensitivity 12/29/2015  . High risk medication use 12/29/2015  . Other dysphagia 02/13/2013  . Encounter for screening colonoscopy 02/13/2013    Past Medical History:  Diagnosis Date  . Arthritis   . Diabetes ([place])   . GERD (gastroesophageal reflux disease)   . Hypercholesterolemia   . Hypertension   . Kidney calculus     Family History  Problem Relation Age of Onset  . Dementia Father   . Colon cancer Neg Hx    Past Surgical History:  Procedure Laterality Date  . COLONOSCOPY, ESOPHAGOGASTRODUODENOSCOPY (EGD) AND ESOPHAGEAL DILATION N/A 03/07/2013   Procedure: COLONOSCOPY, ESOPHAGOGASTRODUODENOSCOPY (EGD) AND ESOPHAGEAL DILATION;  Surgeon: Daneil Dolin, MD;  Location: [facility];  Service: Endoscopy;  Laterality: N/A;  11:15  . ESOPHAGOGASTRODUODENOSCOPY     remote past in the 1990s with dilation  . KIDNEY STONE SURGERY  2011   Social History   Social History Narrative  . Not on file    Objective: Vital Signs: BP 129/87 (BP Location: Left Arm, Patient Position: Sitting, Cuff Size: Normal)   Pulse 89   Resp 15   Ht 5\' 7"  (1.702 m)   Wt 199 lb (90.3 kg)   BMI 31.17 kg/m  Physical Exam  Constitutional: He is oriented to person, place, and time. He appears well-developed and well-nourished.  HENT:  Head: Normocephalic and atraumatic.  Eyes: Pupils are equal, round, and reactive to light. Conjunctivae and EOM are normal.  Neck: Normal range of motion. Neck supple.  Cardiovascular: Normal rate, regular rhythm and normal heart sounds.  Pulmonary/Chest: Effort normal and breath sounds  normal.  Abdominal: Soft. Bowel sounds are normal.  Neurological: He is alert and oriented to person, place, and time.  Skin: Skin is warm and dry. Capillary refill takes less than 2 seconds.  Psychiatric: He has a normal mood and affect. His behavior is normal.  Nursing note and vitals reviewed.    Musculoskeletal Exam: C-spine thoracic lumbar spine good range of motion.  Shoulder joints were in good range of motion with discomfort on range of motion of his left shoulder.  Elbow joints and wrist joints are good range of motion.  He has left CMC arthritis.  He also had a cystic swelling on top of his left thenar muscle.  DIP PIP thickening was noted.  Hip joints knee joints ankles MTPs PIPs were in good range of motion.  He has tenderness on palpation of the right trochanteric bursa consistent with trochanteric bursitis.   CDAI Exam: CDAI Homunculus Exam:   Tenderness:  LUE: glenohumeral  Joint Counts:  CDAI Tender Joint count: 1 CDAI Swollen Joint count: 0  Global Assessments:  Patient Global Assessment: 2 Provider Global Assessment: 2  CDAI Calculated Score: 5   Investigation: No additional findings.  Imaging: No results found.  Recent Labs: Lab Results  Component Value Date   WBC 10.6 02/09/2017   HGB 15.4 02/09/2017   PLT 329 02/09/2017   NA 140 02/09/2017   K 4.1 02/09/2017   CL 98 02/09/2017   CO2 32 02/09/2017   GLUCOSE 84 02/09/2017   BUN 15 02/09/2017   CREATININE 0.91 02/09/2017   BILITOT 0.5 02/09/2017   ALKPHOS 46 09/01/2016   AST 13 02/09/2017   ALT 12 02/09/2017   PROT 6.8 02/09/2017   ALBUMIN 4.2 09/01/2016   CALCIUM 9.5 02/09/2017   GFRAA 103 02/09/2017    Speciality Comments: PLQ Eye Exam 03/15/17 WNL @ [facility] Follow up in 6 months  Procedures:  Large Joint Inj: R greater trochanter on 08/17/2017 11:18 AM Indications: pain Details: 27 G 1.5 in needle, lateral approach  Arthrogram: No  Medications: 1.5 mL lidocaine (PF) 1 %; 40  mg triamcinolone acetonide 40 MG/ML Aspirate: 0 mL Outcome: tolerated well, no immediate complications Procedure, treatment alternatives, risks and benefits explained, specific risks discussed. Consent was given by the patient. Immediately prior to procedure a time out was called to verify the correct patient, procedure, equipment, support staff and site/side marked as required. Patient was prepped and draped in the usual sterile fashion.   Large Joint Inj: L glenohumeral on 08/17/2017 11:19 AM Indications: pain Details: 27 G 1.5 in needle, posterior approach  Arthrogram: No  Medications: 1 mL lidocaine 1 %; 40 mg triamcinolone acetonide 40 MG/ML Aspirate: 0 mL Outcome: tolerated well, no immediate complications Procedure, treatment alternatives, risks and benefits explained, specific risks discussed. Consent was given by the patient. Immediately prior to procedure a time out was called to verify the correct patient, procedure, equipment, support staff and site/side marked as required. Patient was prepped and draped in the usual sterile fashion.     Allergies: Patient has no known allergies.   Assessment / Plan:  Visit Diagnoses: Autoimmune disease ([place]) - Positive ANA, positive double-stranded DNA, history of photosensitivity -he is doing much better on Plaquenil.  Use of sunscreen was discussed.  Plan: Urinalysis, Routine w reflex microscopic, Anti-DNA antibody, double-stranded, C3 and C4, Sedimentation rate  High risk medication use - PLQ. eye exam: 03/15/2017 - Plan: CBC with Differential/Platelet, COMPLETE METABOLIC PANEL WITH GFR  Other fatigue-he has been experiencing some fatigue.  Trochanteric bursitis of right hip-after informed consent was obtained and different options were discussed right trochanteric area was injected with cortisone as described above.  A handout on IT band exercises was given.  Chronic left shoulder pain-patient states he has had problems with rotator  cuff tendinopathy in the past and had cortisone injections.  He had discomfort with range of motion today and requested a cortisone injection.  After informed consent was obtained left shoulder was injected with cortisone as described above.  A handout on shoulder exercises was given.  Primary osteoarthritis of both hands-he has some osteoarthritic changes in his hand.  Left CMC joint thickening was noted.  He also has a cystic area on top of his left thenar muscle.  I have advised him to schedule an appointment for ultrasound evaluation of the cyst for aspiration and possible injection.  Other medical problems are listed as follows:  Renal calcinosis  Vitiligo  History of gastroesophageal reflux (GERD)  History of hypercholesterolemia  History of hypertension  History of diabetes mellitus  History of lithotripsy   Orders: Orders Placed This Encounter  Procedures  . Large Joint Inj  . Large Joint Inj  . CBC with Differential/Platelet  . COMPLETE METABOLIC PANEL WITH GFR  . Urinalysis, Routine w reflex microscopic  . Anti-DNA antibody, double-stranded  . C3 and C4  . Sedimentation rate   No orders of the defined types were placed in this encounter.   Face-to-face time spent with patient was 30 minutes. Greater than 50% of time was spent in counseling and coordination of care.  Follow-Up Instructions: Return in about 5 months (around 01/17/2018) for Autoimmune disease.   Bo Merino, MD  Note - This record has been created using Editor, commissioning.  Chart creation errors have been sought, but may not always  have been located. Such creation errors do not reflect on  the standard of medical care.

## 2017-08-17 ENCOUNTER — Telehealth: Payer: Self-pay | Admitting: Rheumatology

## 2017-08-17 ENCOUNTER — Ambulatory Visit (INDEPENDENT_AMBULATORY_CARE_PROVIDER_SITE_OTHER): Payer: Medicare Other | Admitting: Rheumatology

## 2017-08-17 ENCOUNTER — Encounter: Payer: Self-pay | Admitting: Rheumatology

## 2017-08-17 VITALS — BP 129/87 | HR 89 | Resp 15 | Ht 67.0 in | Wt 199.0 lb

## 2017-08-17 DIAGNOSIS — Z8679 Personal history of other diseases of the circulatory system: Secondary | ICD-10-CM | POA: Diagnosis not present

## 2017-08-17 DIAGNOSIS — D8989 Other specified disorders involving the immune mechanism, not elsewhere classified: Secondary | ICD-10-CM

## 2017-08-17 DIAGNOSIS — G8929 Other chronic pain: Secondary | ICD-10-CM

## 2017-08-17 DIAGNOSIS — L80 Vitiligo: Secondary | ICD-10-CM | POA: Diagnosis not present

## 2017-08-17 DIAGNOSIS — Z79899 Other long term (current) drug therapy: Secondary | ICD-10-CM | POA: Diagnosis not present

## 2017-08-17 DIAGNOSIS — M7061 Trochanteric bursitis, right hip: Secondary | ICD-10-CM

## 2017-08-17 DIAGNOSIS — M19042 Primary osteoarthritis, left hand: Secondary | ICD-10-CM

## 2017-08-17 DIAGNOSIS — Z8639 Personal history of other endocrine, nutritional and metabolic disease: Secondary | ICD-10-CM | POA: Diagnosis not present

## 2017-08-17 DIAGNOSIS — M25512 Pain in left shoulder: Secondary | ICD-10-CM | POA: Diagnosis not present

## 2017-08-17 DIAGNOSIS — Z8719 Personal history of other diseases of the digestive system: Secondary | ICD-10-CM

## 2017-08-17 DIAGNOSIS — R5383 Other fatigue: Secondary | ICD-10-CM | POA: Diagnosis not present

## 2017-08-17 DIAGNOSIS — Z9889 Other specified postprocedural states: Secondary | ICD-10-CM | POA: Diagnosis not present

## 2017-08-17 DIAGNOSIS — N29 Other disorders of kidney and ureter in diseases classified elsewhere: Secondary | ICD-10-CM

## 2017-08-17 DIAGNOSIS — M19041 Primary osteoarthritis, right hand: Secondary | ICD-10-CM

## 2017-08-17 DIAGNOSIS — M359 Systemic involvement of connective tissue, unspecified: Secondary | ICD-10-CM

## 2017-08-17 MED ORDER — TRIAMCINOLONE ACETONIDE 40 MG/ML IJ SUSP
40.0000 mg | INTRAMUSCULAR | Status: AC | PRN
  Administered 2017-08-17: 40 mg via INTRA_ARTICULAR

## 2017-08-17 MED ORDER — LIDOCAINE HCL (PF) 1 % IJ SOLN
1.5000 mL | INTRAMUSCULAR | Status: AC | PRN
  Administered 2017-08-17: 1.5 mL

## 2017-08-17 MED ORDER — LIDOCAINE HCL 1 % IJ SOLN
1.0000 mL | INTRAMUSCULAR | Status: AC | PRN
  Administered 2017-08-17: 1 mL

## 2017-08-17 NOTE — Telephone Encounter (Signed)
Patient called stating he forgot to check if you received his Plaquenil eye exam that he had in February with Dr. Marijo File C. Turner.   Dr. Jule Economy office [phone]  Fax [phone]

## 2017-08-17 NOTE — Patient Instructions (Signed)
Shoulder Exercises Ask your health care provider which exercises are safe for you. Do exercises exactly as told by your health care provider and adjust them as directed. It is normal to feel mild stretching, pulling, tightness, or discomfort as you do these exercises, but you should stop right away if you feel sudden pain or your pain gets worse.Do not begin these exercises until told by your health care provider. RANGE OF MOTION EXERCISES These exercises warm up your muscles and joints and improve the movement and flexibility of your shoulder. These exercises also help to relieve pain, numbness, and tingling. These exercises involve stretching your injured shoulder directly. Exercise A: Pendulum  1. Stand near a wall or a surface that you can hold onto for balance. 2. Bend at the waist and let your left / right arm hang straight down. Use your other arm to support you. Keep your back straight and do not lock your knees. 3. Relax your left / right arm and shoulder muscles, and move your hips and your trunk so your left / right arm swings freely. Your arm should swing because of the motion of your body, not because you are using your arm or shoulder muscles. 4. Keep moving your body so your arm swings in the following directions, as told by your health care provider: ? Side to side. ? Forward and backward. ? In clockwise and counterclockwise circles. 5. Continue each motion for __________ seconds, or for as long as told by your health care provider. 6. Slowly return to the starting position. Repeat __________ times. Complete this exercise __________ times a day. Exercise B:Flexion, Standing  1. Stand and hold a broomstick, a cane, or a similar object. Place your hands a little more than shoulder-width apart on the object. Your left / right hand should be palm-up, and your other hand should be palm-down. 2. Keep your elbow straight and keep your shoulder muscles relaxed. Push the stick down with  your healthy arm to raise your left / right arm in front of your body, and then over your head until you feel a stretch in your shoulder. ? Avoid shrugging your shoulder while you raise your arm. Keep your shoulder blade tucked down toward the middle of your back. 3. Hold for __________ seconds. 4. Slowly return to the starting position. Repeat __________ times. Complete this exercise __________ times a day. Exercise C: Abduction, Standing 1. Stand and hold a broomstick, a cane, or a similar object. Place your hands a little more than shoulder-width apart on the object. Your left / right hand should be palm-up, and your other hand should be palm-down. 2. While keeping your elbow straight and your shoulder muscles relaxed, push the stick across your body toward your left / right side. Raise your left / right arm to the side of your body and then over your head until you feel a stretch in your shoulder. ? Do not raise your arm above shoulder height, unless your health care provider tells you to do that. ? Avoid shrugging your shoulder while you raise your arm. Keep your shoulder blade tucked down toward the middle of your back. 3. Hold for __________ seconds. 4. Slowly return to the starting position. Repeat __________ times. Complete this exercise __________ times a day. Exercise D:Internal Rotation  1. Place your left / right hand behind your back, palm-up. 2. Use your other hand to dangle an exercise band, a towel, or a similar object over your shoulder. Grasp the band with   your left / right hand so you are holding onto both ends. 3. Gently pull up on the band until you feel a stretch in the front of your left / right shoulder. ? Avoid shrugging your shoulder while you raise your arm. Keep your shoulder blade tucked down toward the middle of your back. 4. Hold for __________ seconds. 5. Release the stretch by letting go of the band and lowering your hands. Repeat __________ times. Complete  this exercise __________ times a day. STRETCHING EXERCISES These exercises warm up your muscles and joints and improve the movement and flexibility of your shoulder. These exercises also help to relieve pain, numbness, and tingling. These exercises are done using your healthy shoulder to help stretch the muscles of your injured shoulder. Exercise E: Corner Stretch (External Rotation and Abduction)  1. Stand in a doorway with one of your feet slightly in front of the other. This is called a staggered stance. If you cannot reach your forearms to the door frame, stand facing a corner of a room. 2. Choose one of the following positions as told by your health care provider: ? Place your hands and forearms on the door frame above your head. ? Place your hands and forearms on the door frame at the height of your head. ? Place your hands on the door frame at the height of your elbows. 3. Slowly move your weight onto your front foot until you feel a stretch across your chest and in the front of your shoulders. Keep your head and chest upright and keep your abdominal muscles tight. 4. Hold for __________ seconds. 5. To release the stretch, shift your weight to your back foot. Repeat __________ times. Complete this stretch __________ times a day. Exercise F:Extension, Standing 1. Stand and hold a broomstick, a cane, or a similar object behind your back. ? Your hands should be a little wider than shoulder-width apart. ? Your palms should face away from your back. 2. Keeping your elbows straight and keeping your shoulder muscles relaxed, move the stick away from your body until you feel a stretch in your shoulder. ? Avoid shrugging your shoulders while you move the stick. Keep your shoulder blade tucked down toward the middle of your back. 3. Hold for __________ seconds. 4. Slowly return to the starting position. Repeat __________ times. Complete this exercise __________ times a day. STRENGTHENING  EXERCISES These exercises build strength and endurance in your shoulder. Endurance is the ability to use your muscles for a long time, even after they get tired. Exercise G:External Rotation  1. Sit in a stable chair without armrests. 2. Secure an exercise band at elbow height on your left / right side. 3. Place a soft object, such as a folded towel or a small pillow, between your left / right upper arm and your body to move your elbow a few inches away (about 10 cm) from your side. 4. Hold the end of the band so it is tight and there is no slack. 5. Keeping your elbow pressed against the soft object, move your left / right forearm out, away from your abdomen. Keep your body steady so only your forearm moves. 6. Hold for __________ seconds. 7. Slowly return to the starting position. Repeat __________ times. Complete this exercise __________ times a day. Exercise H:Shoulder Abduction  1. Sit in a stable chair without armrests, or stand. 2. Hold a __________ weight in your left / right hand, or hold an exercise band with both hands.   3. Start with your arms straight down and your left / right palm facing in, toward your body. 4. Slowly lift your left / right hand out to your side. Do not lift your hand above shoulder height unless your health care provider tells you that this is safe. ? Keep your arms straight. ? Avoid shrugging your shoulder while you do this movement. Keep your shoulder blade tucked down toward the middle of your back. 5. Hold for __________ seconds. 6. Slowly lower your arm, and return to the starting position. Repeat __________ times. Complete this exercise __________ times a day. Exercise I:Shoulder Extension 1. Sit in a stable chair without armrests, or stand. 2. Secure an exercise band to a stable object in front of you where it is at shoulder height. 3. Hold one end of the exercise band in each hand. Your palms should face each other. 4. Straighten your elbows and  lift your hands up to shoulder height. 5. Step back, away from the secured end of the exercise band, until the band is tight and there is no slack. 6. Squeeze your shoulder blades together as you pull your hands down to the sides of your thighs. Stop when your hands are straight down by your sides. Do not let your hands go behind your body. 7. Hold for __________ seconds. 8. Slowly return to the starting position. Repeat __________ times. Complete this exercise __________ times a day. Exercise J:Standing Shoulder Row 1. Sit in a stable chair without armrests, or stand. 2. Secure an exercise band to a stable object in front of you so it is at waist height. 3. Hold one end of the exercise band in each hand. Your palms should be in a thumbs-up position. 4. Bend each of your elbows to an "L" shape (about 90 degrees) and keep your upper arms at your sides. 5. Step back until the band is tight and there is no slack. 6. Slowly pull your elbows back behind you. 7. Hold for __________ seconds. 8. Slowly return to the starting position. Repeat __________ times. Complete this exercise __________ times a day. Exercise K:Shoulder Press-Ups  1. Sit in a stable chair that has armrests. Sit upright, with your feet flat on the floor. 2. Put your hands on the armrests so your elbows are bent and your fingers are pointing forward. Your hands should be about even with the sides of your body. 3. Push down on the armrests and use your arms to lift yourself off of the chair. Straighten your elbows and lift yourself up as much as you comfortably can. ? Move your shoulder blades down, and avoid letting your shoulders move up toward your ears. ? Keep your feet on the ground. As you get stronger, your feet should support less of your body weight as you lift yourself up. 4. Hold for __________ seconds. 5. Slowly lower yourself back into the chair. Repeat __________ times. Complete this exercise __________ times a  day. Exercise L: Wall Push-Ups  1. Stand so you are facing a stable wall. Your feet should be about one arm-length away from the wall. 2. Lean forward and place your palms on the wall at shoulder height. 3. Keep your feet flat on the floor as you bend your elbows and lean forward toward the wall. 4. Hold for __________ seconds. 5. Straighten your elbows to push yourself back to the starting position. Repeat __________ times. Complete this exercise __________ times a day. This information is not intended to replace advice   given to you by your health care provider. Make sure you discuss any questions you have with your health care provider. Document Released: 11/24/2004 Document Revised: 10/05/2015 Document Reviewed: 09/21/2014 Elsevier Interactive Patient Education  2018 [employer] Band Syndrome Rehab Ask your health care provider which exercises are safe for you. Do exercises exactly as told by your health care provider and adjust them as directed. It is normal to feel mild stretching, pulling, tightness, or discomfort as you do these exercises, but you should stop right away if you feel sudden pain or your pain gets worse.Do not begin these exercises until told by your health care provider. Stretching and range of motion exercises These exercises warm up your muscles and joints and improve the movement and flexibility of your hip and pelvis. Exercise A: Quadriceps, prone  1. Lie on your abdomen on a firm surface, such as a bed or padded floor. 2. Bend your left / right knee and hold your ankle. If you cannot reach your ankle or pant leg, loop a belt around your foot and grab the belt instead. 3. Gently pull your heel toward your buttocks. Your knee should not slide out to the side. You should feel a stretch in the front of your thigh and knee. 4. Hold this position for __________ seconds. Repeat __________ times. Complete this stretch __________ times a day. Exercise B:  Iliotibial band  1. Lie on your side with your left / right leg in the top position. 2. Bend both of your knees and grab your left / right ankle. Stretch out your bottom arm to help you balance. 3. Slowly bring your top knee back so your thigh goes behind your trunk. 4. Slowly lower your top leg toward the floor until you feel a gentle stretch on the outside of your left / right hip and thigh. If you do not feel a stretch and your knee will not fall farther, place the heel of your other foot on top of your knee and pull your knee down toward the floor with your foot. 5. Hold this position for __________ seconds. Repeat __________ times. Complete this stretch __________ times a day. Strengthening exercises These exercises build strength and endurance in your hip and pelvis. Endurance is the ability to use your muscles for a long time, even after they get tired. Exercise C: Straight leg raises ( hip abductors) 1. Lie on your side with your left / right leg in the top position. Lie so your head, shoulder, knee, and hip line up. You may bend your bottom knee to help you balance. 2. Roll your hips slightly forward so your hips are stacked directly over each other and your left / right knee is facing forward. 3. Tense the muscles in your outer thigh and lift your top leg 4-6 inches (10-15 cm). 4. Hold this position for __________ seconds. 5. Slowly return to the starting position. Let your muscles relax completely before doing another repetition. Repeat __________ times. Complete this exercise __________ times a day. Exercise D: Straight leg raises ( hip extensors) 1. Lie on your abdomen on your bed or a firm surface. You can put a pillow under your hips if that is more comfortable. 2. Bend your left / right knee so your foot is straight up in the air. 3. Squeeze your buttock muscles and lift your left / right thigh off the bed. Do not let your back arch. 4. Tense this muscle as hard as you can  without  increasing any knee pain. 5. Hold this position for __________ seconds. 6. Slowly lower your leg to the starting position and allow it to relax completely. Repeat __________ times. Complete this exercise __________ times a day. Exercise E: Hip hike 1. Stand sideways on a bottom step. Stand on your left / right leg with your other foot unsupported next to the step. You can hold onto the railing or wall if needed for balance. 2. Keep your knees straight and your torso square. Then, lift your left / right hip up toward the ceiling. 3. Slowly let your left / right hip lower toward the floor, past the starting position. Your foot should get closer to the floor. Do not lean or bend your knees. Repeat __________ times. Complete this exercise __________ times a day. This information is not intended to replace advice given to you by your health care provider. Make sure you discuss any questions you have with your health care provider. Document Released: 01/10/2005 Document Revised: 09/15/2015 Document Reviewed: 12/12/2014 Elsevier Interactive Patient Education  [employer].

## 2017-08-18 DIAGNOSIS — Z299 Encounter for prophylactic measures, unspecified: Secondary | ICD-10-CM | POA: Diagnosis not present

## 2017-08-18 DIAGNOSIS — Z6830 Body mass index (BMI) 30.0-30.9, adult: Secondary | ICD-10-CM | POA: Diagnosis not present

## 2017-08-18 DIAGNOSIS — I10 Essential (primary) hypertension: Secondary | ICD-10-CM | POA: Diagnosis not present

## 2017-08-18 DIAGNOSIS — E1165 Type 2 diabetes mellitus with hyperglycemia: Secondary | ICD-10-CM | POA: Diagnosis not present

## 2017-08-18 DIAGNOSIS — G8929 Other chronic pain: Secondary | ICD-10-CM | POA: Diagnosis not present

## 2017-08-18 DIAGNOSIS — M329 Systemic lupus erythematosus, unspecified: Secondary | ICD-10-CM | POA: Diagnosis not present

## 2017-08-18 DIAGNOSIS — Z79899 Other long term (current) drug therapy: Secondary | ICD-10-CM | POA: Diagnosis not present

## 2017-08-18 LAB — COMPLETE METABOLIC PANEL WITH GFR
AG Ratio: 2.4 (calc) (ref 1.0–2.5)
ALT: 13 U/L (ref 9–46)
AST: 15 U/L (ref 10–35)
Albumin: 4.8 g/dL (ref 3.6–5.1)
Alkaline phosphatase (APISO): 45 U/L (ref 40–115)
BILIRUBIN TOTAL: 0.5 mg/dL (ref 0.2–1.2)
BUN: 18 mg/dL (ref 7–25)
CHLORIDE: 98 mmol/L (ref 98–110)
CO2: 32 mmol/L (ref 20–32)
CREATININE: 0.96 mg/dL (ref 0.70–1.25)
Calcium: 9.9 mg/dL (ref 8.6–10.3)
GFR, Est African American: 96 mL/min/{1.73_m2} (ref >=60)
GFR, Est Non African American: 83 mL/min/{1.73_m2} (ref >=60)
GLOBULIN: 2 g/dL (ref 1.9–3.7)
Glucose, Bld: 107 mg/dL — ABNORMAL HIGH (ref 65–99)
Potassium: 4.4 mmol/L (ref 3.5–5.3)
SODIUM: 140 mmol/L (ref 135–146)
Total Protein: 6.8 g/dL (ref 6.1–8.1)

## 2017-08-18 LAB — CBC WITH DIFFERENTIAL/PLATELET
Basophils Absolute: 92 cells/uL (ref 0–200)
Basophils Relative: 1.1 %
EOS ABS: 143 {cells}/uL (ref 15–500)
Eosinophils Relative: 1.7 %
HCT: 46.8 % (ref 38.5–50.0)
HEMOGLOBIN: 15.7 g/dL (ref 13.2–17.1)
Lymphs Abs: 2134 cells/uL (ref 850–3900)
MCH: 29.6 pg (ref 27.0–33.0)
MCHC: 33.5 g/dL (ref 32.0–36.0)
MCV: 88.3 fL (ref 80.0–100.0)
MONOS PCT: 8.8 %
MPV: 11 fL (ref 7.5–12.5)
NEUTROS ABS: 5292 {cells}/uL (ref 1500–7800)
Neutrophils Relative %: 63 %
Platelets: 284 10*3/uL (ref 140–400)
RBC: 5.3 10*6/uL (ref 4.20–5.80)
RDW: 12.5 % (ref 11.0–15.0)
Total Lymphocyte: 25.4 %
WBC: 8.4 10*3/uL (ref 3.8–10.8)
WBCMIX: 739 {cells}/uL (ref 200–950)

## 2017-08-18 LAB — URINALYSIS, ROUTINE W REFLEX MICROSCOPIC
BILIRUBIN URINE: NEGATIVE
Glucose, UA: NEGATIVE
Hgb urine dipstick: NEGATIVE
Ketones, ur: NEGATIVE
Leukocytes, UA: NEGATIVE
Nitrite: NEGATIVE
Protein, ur: NEGATIVE
Specific Gravity, Urine: 1.02 (ref 1.001–1.03)

## 2017-08-18 LAB — ANTI-DNA ANTIBODY, DOUBLE-STRANDED: ds DNA Ab: 11 IU/mL — ABNORMAL HIGH

## 2017-08-18 LAB — C3 AND C4
C3 Complement: 133 mg/dL (ref 82–185)
C4 COMPLEMENT: 26 mg/dL (ref 15–53)

## 2017-08-18 LAB — SEDIMENTATION RATE: Sed Rate: 2 mm/h (ref 0–20)

## 2017-08-18 NOTE — Telephone Encounter (Signed)
Patient advised we do have his PLQ eye exam on file.

## 2017-08-18 NOTE — Progress Notes (Signed)
WNLs

## 2017-08-18 NOTE — Progress Notes (Signed)
Labs are stable.

## 2017-08-25 DIAGNOSIS — I10 Essential (primary) hypertension: Secondary | ICD-10-CM | POA: Diagnosis not present

## 2017-08-25 DIAGNOSIS — M159 Polyosteoarthritis, unspecified: Secondary | ICD-10-CM | POA: Diagnosis not present

## 2017-08-25 DIAGNOSIS — E119 Type 2 diabetes mellitus without complications: Secondary | ICD-10-CM | POA: Diagnosis not present

## 2017-09-06 ENCOUNTER — Ambulatory Visit (INDEPENDENT_AMBULATORY_CARE_PROVIDER_SITE_OTHER): Payer: Medicare Other

## 2017-09-06 ENCOUNTER — Other Ambulatory Visit: Payer: Self-pay | Admitting: *Deleted

## 2017-09-06 ENCOUNTER — Ambulatory Visit: Payer: Medicare Other | Admitting: Rheumatology

## 2017-09-06 DIAGNOSIS — M79642 Pain in left hand: Secondary | ICD-10-CM

## 2017-09-06 NOTE — Addendum Note (Signed)
Addended by: Bo Merino on: 09/06/2017 03:16 PM   Modules accepted: Orders

## 2017-09-06 NOTE — Progress Notes (Addendum)
Patient is here to get evaluation of his left thenar eminence mass.  The x-ray of the hand was consistent with osteoarthritis.  I am uncertain about the etiology of the mass in the thenar eminence region.  I discussed the option of obtaining MRI or referral to hand surgeon.  Patient was in agreement for going to the hand surgeon first.  We will make the referral for the patient. Bo Merino, MD

## 2017-09-07 DIAGNOSIS — K219 Gastro-esophageal reflux disease without esophagitis: Secondary | ICD-10-CM | POA: Diagnosis not present

## 2017-09-07 DIAGNOSIS — Z6830 Body mass index (BMI) 30.0-30.9, adult: Secondary | ICD-10-CM | POA: Diagnosis not present

## 2017-09-07 DIAGNOSIS — E1165 Type 2 diabetes mellitus with hyperglycemia: Secondary | ICD-10-CM | POA: Diagnosis not present

## 2017-09-07 DIAGNOSIS — Z299 Encounter for prophylactic measures, unspecified: Secondary | ICD-10-CM | POA: Diagnosis not present

## 2017-09-07 DIAGNOSIS — I10 Essential (primary) hypertension: Secondary | ICD-10-CM | POA: Diagnosis not present

## 2017-09-07 DIAGNOSIS — E7800 Pure hypercholesterolemia, unspecified: Secondary | ICD-10-CM | POA: Diagnosis not present

## 2017-09-28 DIAGNOSIS — Z125 Encounter for screening for malignant neoplasm of prostate: Secondary | ICD-10-CM | POA: Diagnosis not present

## 2017-09-28 DIAGNOSIS — E1165 Type 2 diabetes mellitus with hyperglycemia: Secondary | ICD-10-CM | POA: Diagnosis not present

## 2017-09-28 DIAGNOSIS — Z7189 Other specified counseling: Secondary | ICD-10-CM | POA: Diagnosis not present

## 2017-09-28 DIAGNOSIS — Z6830 Body mass index (BMI) 30.0-30.9, adult: Secondary | ICD-10-CM | POA: Diagnosis not present

## 2017-09-28 DIAGNOSIS — R5383 Other fatigue: Secondary | ICD-10-CM | POA: Diagnosis not present

## 2017-09-28 DIAGNOSIS — I10 Essential (primary) hypertension: Secondary | ICD-10-CM | POA: Diagnosis not present

## 2017-09-28 DIAGNOSIS — Z79899 Other long term (current) drug therapy: Secondary | ICD-10-CM | POA: Diagnosis not present

## 2017-09-28 DIAGNOSIS — Z0000 Encounter for general adult medical examination without abnormal findings: Secondary | ICD-10-CM | POA: Diagnosis not present

## 2017-09-28 DIAGNOSIS — Z1331 Encounter for screening for depression: Secondary | ICD-10-CM | POA: Diagnosis not present

## 2017-09-28 DIAGNOSIS — Z299 Encounter for prophylactic measures, unspecified: Secondary | ICD-10-CM | POA: Diagnosis not present

## 2017-09-28 DIAGNOSIS — Z1339 Encounter for screening examination for other mental health and behavioral disorders: Secondary | ICD-10-CM | POA: Diagnosis not present

## 2017-09-28 DIAGNOSIS — E7800 Pure hypercholesterolemia, unspecified: Secondary | ICD-10-CM | POA: Diagnosis not present

## 2017-09-28 DIAGNOSIS — Z1211 Encounter for screening for malignant neoplasm of colon: Secondary | ICD-10-CM | POA: Diagnosis not present

## 2017-10-05 DIAGNOSIS — E119 Type 2 diabetes mellitus without complications: Secondary | ICD-10-CM | POA: Diagnosis not present

## 2017-10-05 DIAGNOSIS — I10 Essential (primary) hypertension: Secondary | ICD-10-CM | POA: Diagnosis not present

## 2017-10-05 DIAGNOSIS — M159 Polyosteoarthritis, unspecified: Secondary | ICD-10-CM | POA: Diagnosis not present

## 2017-10-25 DIAGNOSIS — Z23 Encounter for immunization: Secondary | ICD-10-CM | POA: Diagnosis not present

## 2017-11-01 DIAGNOSIS — M79642 Pain in left hand: Secondary | ICD-10-CM | POA: Diagnosis not present

## 2017-11-01 DIAGNOSIS — M13842 Other specified arthritis, left hand: Secondary | ICD-10-CM | POA: Diagnosis not present

## 2017-11-01 DIAGNOSIS — M25512 Pain in left shoulder: Secondary | ICD-10-CM | POA: Diagnosis not present

## 2017-11-13 DIAGNOSIS — Z299 Encounter for prophylactic measures, unspecified: Secondary | ICD-10-CM | POA: Diagnosis not present

## 2017-11-13 DIAGNOSIS — E119 Type 2 diabetes mellitus without complications: Secondary | ICD-10-CM | POA: Diagnosis not present

## 2017-11-13 DIAGNOSIS — I10 Essential (primary) hypertension: Secondary | ICD-10-CM | POA: Diagnosis not present

## 2017-11-13 DIAGNOSIS — Z79899 Other long term (current) drug therapy: Secondary | ICD-10-CM | POA: Diagnosis not present

## 2017-11-13 DIAGNOSIS — M329 Systemic lupus erythematosus, unspecified: Secondary | ICD-10-CM | POA: Diagnosis not present

## 2017-11-13 DIAGNOSIS — Z713 Dietary counseling and surveillance: Secondary | ICD-10-CM | POA: Diagnosis not present

## 2017-11-13 DIAGNOSIS — Z6830 Body mass index (BMI) 30.0-30.9, adult: Secondary | ICD-10-CM | POA: Diagnosis not present

## 2017-11-13 DIAGNOSIS — M159 Polyosteoarthritis, unspecified: Secondary | ICD-10-CM | POA: Diagnosis not present

## 2017-11-15 ENCOUNTER — Other Ambulatory Visit: Payer: Self-pay | Admitting: Rheumatology

## 2017-11-15 NOTE — Telephone Encounter (Signed)
Last visit: 08/17/17 Next Visit: 12/28/17 Labs: 08/17/17 WNL PLQ Eye Exam 03/15/17 WNL   Okay to refill per Dr. Estanislado Pandy

## 2017-12-01 DIAGNOSIS — M159 Polyosteoarthritis, unspecified: Secondary | ICD-10-CM | POA: Diagnosis not present

## 2017-12-01 DIAGNOSIS — E119 Type 2 diabetes mellitus without complications: Secondary | ICD-10-CM | POA: Diagnosis not present

## 2017-12-01 DIAGNOSIS — I10 Essential (primary) hypertension: Secondary | ICD-10-CM | POA: Diagnosis not present

## 2017-12-14 NOTE — Progress Notes (Signed)
Office Visit Note  Patient: Daryl Bell             Date of Birth: [birth year]           MRN: [id number]             PCP: Monico Blitz, MD Referring: Monico Blitz, MD Visit Date: 12/28/2017 Occupation: @GUAROCC @  Subjective:  Pain in both hips.    History of Present Illness: Daryl Bell is a 67 y.o. male with history of autoimmune disease.  He states he has been having pain and discomfort in his bilateral hips which he describes over the trochanteric bursa area.  He was also seen by Dr. Amedeo Plenty for left [facility] discomfort.  He states that he suggested left [facility] replacement.  He was given a cortisone injection and a CMC brace which is been helpful.  He denies any joint swelling.  There is no history of malar rash photosensitivity or oral ulcers.  Has been having some discomfort in his left shoulder.  He has a shoulder specialist for which he is awaiting follow-up appointment.  Activities of Daily Living:  Patient reports morning stiffness for 1-2 hours.   Patient Reports nocturnal pain.  Difficulty dressing/grooming: Denies Difficulty climbing stairs: Reports Difficulty getting out of chair: Denies Difficulty using hands for taps, buttons, cutlery, and/or writing: Reports  Review of Systems  Constitutional: Negative for fatigue.  HENT: Positive for mouth dryness. Negative for mouth sores, trouble swallowing and trouble swallowing.   Eyes: Negative for pain, redness, itching and dryness.  Respiratory: Negative for shortness of breath, wheezing and difficulty breathing.   Cardiovascular: Negative for chest pain, palpitations and swelling in legs/feet.  Gastrointestinal: Negative for abdominal pain, blood in stool, constipation and diarrhea.  Endocrine: Negative for increased urination.  Genitourinary: Negative for painful urination, pelvic pain and urgency.  Musculoskeletal: Positive for arthralgias, joint pain and morning stiffness. Negative for joint swelling.  Skin:  Negative for rash and hair loss.  Allergic/Immunologic: Negative for susceptible to infections.  Neurological: Negative for dizziness, headaches, memory loss and weakness.  Hematological: Negative for bruising/bleeding tendency.  Psychiatric/Behavioral: Negative for confusion. The patient is not nervous/anxious.     PMFS History:  Patient Active Problem List   Diagnosis Date Noted  . Neck pain 03/31/2016  . Acute pain of left shoulder 03/31/2016  . ANA positive 12/29/2015  . 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 Photosensitivity 12/29/2015  . High risk medication use 12/29/2015  . Other dysphagia 02/13/2013  . Encounter for screening colonoscopy 02/13/2013    Past Medical History:  Diagnosis Date  . Arthritis   . Diabetes ([place])   . GERD (gastroesophageal reflux disease)   . Hypercholesterolemia   . Hypertension   . Kidney calculus     Family History  Problem Relation Age of Onset  . Dementia Father   . Colon cancer Neg Hx    Past Surgical History:  Procedure Laterality Date  . COLONOSCOPY, ESOPHAGOGASTRODUODENOSCOPY (EGD) AND ESOPHAGEAL DILATION N/A 03/07/2013   Procedure: COLONOSCOPY, ESOPHAGOGASTRODUODENOSCOPY (EGD) AND ESOPHAGEAL DILATION;  Surgeon: Daneil Dolin, MD;  Location: [facility];  Service: Endoscopy;  Laterality: N/A;  11:15  . ESOPHAGOGASTRODUODENOSCOPY     remote past in the 1990s with dilation  . KIDNEY STONE SURGERY  2011   Social History   Social History Narrative  . Not on file    Objective: Vital Signs: BP 121/79 (BP Location: Left Arm, Patient Position: Sitting, Cuff Size: Normal)   Pulse  82   Resp 15   Ht 5\' 7"  (1.702 m)   Wt 208 lb (94.3 kg)   BMI 32.58 kg/m    Physical Exam  Constitutional: He is oriented to person, place, and time. He appears well-developed and well-nourished.  HENT:  Head: Normocephalic and atraumatic.  Eyes: Pupils are equal, round, and reactive to light. Conjunctivae and EOM are normal.  Neck: Normal range  of motion. Neck supple.  Cardiovascular: Normal rate, regular rhythm and normal heart sounds.  Pulmonary/Chest: Effort normal and breath sounds normal.  Abdominal: Soft. Bowel sounds are normal.  Neurological: He is alert and oriented to person, place, and time.  Skin: Skin is warm and dry. Capillary refill takes less than 2 seconds.  Psychiatric: He has a normal mood and affect. His behavior is normal.  Nursing note and vitals reviewed.    Musculoskeletal Exam: C-spine thoracic lumbar spine good range of motion.  Shoulder joints elbow joints wrist joint spine good range of motion.  He has DIP PIP thickening consistent with osteoarthritis in his hands.  Hip joints knee joints with good range of motion with no synovitis.  He had tenderness over bilateral trochanteric bursa more prominent on the right side.  These findings were consistent with trochanteric bursitis.  CDAI Exam: CDAI Score: Not documented Patient Global Assessment: Not documented; Provider Global Assessment: Not documented Swollen: Not documented; Tender: Not documented Joint Exam   Not documented   There is currently no information documented on the homunculus. Go to the Rheumatology activity and complete the homunculus joint exam.  Investigation: No additional findings.  Imaging: No results found.  Recent Labs: Lab Results  Component Value Date   WBC 8.4 08/17/2017   HGB 15.7 08/17/2017   PLT 284 08/17/2017   NA 140 08/17/2017   K 4.4 08/17/2017   CL 98 08/17/2017   CO2 32 08/17/2017   GLUCOSE 107 (H) 08/17/2017   BUN 18 08/17/2017   CREATININE 0.96 08/17/2017   BILITOT 0.5 08/17/2017   ALKPHOS 46 09/01/2016   AST 15 08/17/2017   ALT 13 08/17/2017   PROT 6.8 08/17/2017   ALBUMIN 4.2 09/01/2016   CALCIUM 9.9 08/17/2017   GFRAA 96 08/17/2017    Speciality Comments: PLQ Eye Exam 03/15/17 WNL @ [facility] Follow up in 6 months  Procedures:  No procedures performed Allergies: Patient has no known  allergies.   Assessment / Plan:     Visit Diagnoses: Autoimmune disease ([place]) - Positive ANA, positive double-stranded DNA, history of photosensitivity.  Patient is doing quite well on Plaquenil without any symptoms.  High risk medication use - Current regimen includes Plaquenil 200 mg twice daily.  Last Plaquenil eye exam normal on 03/15/2017.  Most recent CBC/CMP within normal limits on 08/17/2017.  Due for CBC/CMP today and then every 5 months.  Standing orders placed.  Recommend annual flu and pneumonia vaccines as indicated.  Patient received Shingrix vaccine.   Autoimmune labs stable on 08/17/2017. - Plan: CBC with Differential/Platelet, COMPLETE METABOLIC PANEL WITH GFR, CBC with Differential/Platelet, COMPLETE METABOLIC PANEL WITH GFR  Trochanteric bursitis of right hip-he had tenderness over right SI joint.  I offered cortisone injection which he declined.  He is IT band exercises which she will continue to do.  Primary osteoarthritis of both hands-he has been having discomfort in the left CMC joint.  He had cortisone injection by Dr. Amedeo Plenty and has been using a brace.  Joint protection was discussed.  History of hypertension-his blood pressure  is a stable.  Other medical problems are listed as follows:  History of hypercholesterolemia  History of gastroesophageal reflux (GERD)  Renal calcinosis  History of diabetes mellitus  Vitiligo   Orders: Orders Placed This Encounter  Procedures  . CBC with Differential/Platelet  . COMPLETE METABOLIC PANEL WITH GFR  . CBC with Differential/Platelet  . COMPLETE METABOLIC PANEL WITH GFR   No orders of the defined types were placed in this encounter.   .  Follow-Up Instructions: Return in about 5 months (around 05/29/2018) for Autoimmune disease.   Bo Merino, MD  Note - This record has been created using Editor, commissioning.  Chart creation errors have been sought, but may not always  have been located. Such creation errors do  not reflect on  the standard of medical care.

## 2017-12-20 DIAGNOSIS — I10 Essential (primary) hypertension: Secondary | ICD-10-CM | POA: Diagnosis not present

## 2017-12-20 DIAGNOSIS — Z6830 Body mass index (BMI) 30.0-30.9, adult: Secondary | ICD-10-CM | POA: Diagnosis not present

## 2017-12-20 DIAGNOSIS — Z299 Encounter for prophylactic measures, unspecified: Secondary | ICD-10-CM | POA: Diagnosis not present

## 2017-12-20 DIAGNOSIS — E1165 Type 2 diabetes mellitus with hyperglycemia: Secondary | ICD-10-CM | POA: Diagnosis not present

## 2017-12-20 DIAGNOSIS — M329 Systemic lupus erythematosus, unspecified: Secondary | ICD-10-CM | POA: Diagnosis not present

## 2017-12-28 ENCOUNTER — Encounter: Payer: Self-pay | Admitting: Rheumatology

## 2017-12-28 ENCOUNTER — Ambulatory Visit (INDEPENDENT_AMBULATORY_CARE_PROVIDER_SITE_OTHER): Payer: Medicare Other | Admitting: Rheumatology

## 2017-12-28 VITALS — BP 121/79 | HR 82 | Resp 15 | Ht 67.0 in | Wt 208.0 lb

## 2017-12-28 DIAGNOSIS — M19041 Primary osteoarthritis, right hand: Secondary | ICD-10-CM | POA: Diagnosis not present

## 2017-12-28 DIAGNOSIS — Z79899 Other long term (current) drug therapy: Secondary | ICD-10-CM | POA: Diagnosis not present

## 2017-12-28 DIAGNOSIS — Z8639 Personal history of other endocrine, nutritional and metabolic disease: Secondary | ICD-10-CM

## 2017-12-28 DIAGNOSIS — M7061 Trochanteric bursitis, right hip: Secondary | ICD-10-CM | POA: Diagnosis not present

## 2017-12-28 DIAGNOSIS — Z8679 Personal history of other diseases of the circulatory system: Secondary | ICD-10-CM | POA: Diagnosis not present

## 2017-12-28 DIAGNOSIS — M19042 Primary osteoarthritis, left hand: Secondary | ICD-10-CM | POA: Diagnosis not present

## 2017-12-28 DIAGNOSIS — N29 Other disorders of kidney and ureter in diseases classified elsewhere: Secondary | ICD-10-CM

## 2017-12-28 DIAGNOSIS — L80 Vitiligo: Secondary | ICD-10-CM | POA: Diagnosis not present

## 2017-12-28 DIAGNOSIS — M359 Systemic involvement of connective tissue, unspecified: Secondary | ICD-10-CM

## 2017-12-28 DIAGNOSIS — Z8719 Personal history of other diseases of the digestive system: Secondary | ICD-10-CM

## 2017-12-29 LAB — CBC WITH DIFFERENTIAL/PLATELET
Basophils Absolute: 98 cells/uL (ref 0–200)
Basophils Relative: 1.1 %
EOS PCT: 2.9 %
Eosinophils Absolute: 258 cells/uL (ref 15–500)
HCT: 43.7 % (ref 38.5–50.0)
HEMOGLOBIN: 15 g/dL (ref 13.2–17.1)
Lymphs Abs: 2750 cells/uL (ref 850–3900)
MCH: 30.7 pg (ref 27.0–33.0)
MCHC: 34.3 g/dL (ref 32.0–36.0)
MCV: 89.5 fL (ref 80.0–100.0)
MONOS PCT: 8.6 %
MPV: 11.4 fL (ref 7.5–12.5)
NEUTROS ABS: 5029 {cells}/uL (ref 1500–7800)
Neutrophils Relative %: 56.5 %
Platelets: 286 10*3/uL (ref 140–400)
RBC: 4.88 10*6/uL (ref 4.20–5.80)
RDW: 12.1 % (ref 11.0–15.0)
Total Lymphocyte: 30.9 %
WBC mixed population: 765 cells/uL (ref 200–950)
WBC: 8.9 10*3/uL (ref 3.8–10.8)

## 2017-12-29 LAB — COMPLETE METABOLIC PANEL WITH GFR
AG Ratio: 2.2 (calc) (ref 1.0–2.5)
ALKALINE PHOSPHATASE (APISO): 46 U/L (ref 40–115)
ALT: 20 U/L (ref 9–46)
AST: 18 U/L (ref 10–35)
Albumin: 4.4 g/dL (ref 3.6–5.1)
BILIRUBIN TOTAL: 0.4 mg/dL (ref 0.2–1.2)
BUN: 14 mg/dL (ref 7–25)
CHLORIDE: 99 mmol/L (ref 98–110)
CO2: 32 mmol/L (ref 20–32)
Calcium: 9.8 mg/dL (ref 8.6–10.3)
Creat: 1 mg/dL (ref 0.70–1.25)
GFR, Est African American: 91 mL/min/{1.73_m2} (ref >=60)
GFR, Est Non African American: 79 mL/min/{1.73_m2} (ref >=60)
GLOBULIN: 2 g/dL (ref 1.9–3.7)
Glucose, Bld: 79 mg/dL (ref 65–99)
POTASSIUM: 4.3 mmol/L (ref 3.5–5.3)
SODIUM: 138 mmol/L (ref 135–146)
Total Protein: 6.4 g/dL (ref 6.1–8.1)

## 2017-12-29 NOTE — Progress Notes (Signed)
WNLs

## 2018-01-03 DIAGNOSIS — E119 Type 2 diabetes mellitus without complications: Secondary | ICD-10-CM | POA: Diagnosis not present

## 2018-01-03 DIAGNOSIS — I10 Essential (primary) hypertension: Secondary | ICD-10-CM | POA: Diagnosis not present

## 2018-01-03 DIAGNOSIS — M159 Polyosteoarthritis, unspecified: Secondary | ICD-10-CM | POA: Diagnosis not present

## 2018-01-30 DIAGNOSIS — I10 Essential (primary) hypertension: Secondary | ICD-10-CM | POA: Diagnosis not present

## 2018-01-30 DIAGNOSIS — M159 Polyosteoarthritis, unspecified: Secondary | ICD-10-CM | POA: Diagnosis not present

## 2018-01-30 DIAGNOSIS — E119 Type 2 diabetes mellitus without complications: Secondary | ICD-10-CM | POA: Diagnosis not present

## 2018-02-14 DIAGNOSIS — M329 Systemic lupus erythematosus, unspecified: Secondary | ICD-10-CM | POA: Diagnosis not present

## 2018-02-14 DIAGNOSIS — Z299 Encounter for prophylactic measures, unspecified: Secondary | ICD-10-CM | POA: Diagnosis not present

## 2018-02-14 DIAGNOSIS — Z789 Other specified health status: Secondary | ICD-10-CM | POA: Diagnosis not present

## 2018-02-14 DIAGNOSIS — E1165 Type 2 diabetes mellitus with hyperglycemia: Secondary | ICD-10-CM | POA: Diagnosis not present

## 2018-02-14 DIAGNOSIS — Z6831 Body mass index (BMI) 31.0-31.9, adult: Secondary | ICD-10-CM | POA: Diagnosis not present

## 2018-02-14 DIAGNOSIS — M19019 Primary osteoarthritis, unspecified shoulder: Secondary | ICD-10-CM | POA: Diagnosis not present

## 2018-02-14 DIAGNOSIS — M19012 Primary osteoarthritis, left shoulder: Secondary | ICD-10-CM | POA: Diagnosis not present

## 2018-02-14 DIAGNOSIS — I10 Essential (primary) hypertension: Secondary | ICD-10-CM | POA: Diagnosis not present

## 2018-03-01 DIAGNOSIS — G8929 Other chronic pain: Secondary | ICD-10-CM | POA: Diagnosis not present

## 2018-03-01 DIAGNOSIS — Z6833 Body mass index (BMI) 33.0-33.9, adult: Secondary | ICD-10-CM | POA: Diagnosis not present

## 2018-03-01 DIAGNOSIS — Z299 Encounter for prophylactic measures, unspecified: Secondary | ICD-10-CM | POA: Diagnosis not present

## 2018-03-01 DIAGNOSIS — Z79899 Other long term (current) drug therapy: Secondary | ICD-10-CM | POA: Diagnosis not present

## 2018-03-01 DIAGNOSIS — E1165 Type 2 diabetes mellitus with hyperglycemia: Secondary | ICD-10-CM | POA: Diagnosis not present

## 2018-03-01 DIAGNOSIS — F419 Anxiety disorder, unspecified: Secondary | ICD-10-CM | POA: Diagnosis not present

## 2018-03-01 DIAGNOSIS — R2689 Other abnormalities of gait and mobility: Secondary | ICD-10-CM | POA: Diagnosis not present

## 2018-03-01 DIAGNOSIS — I10 Essential (primary) hypertension: Secondary | ICD-10-CM | POA: Diagnosis not present

## 2018-03-06 DIAGNOSIS — I10 Essential (primary) hypertension: Secondary | ICD-10-CM | POA: Diagnosis not present

## 2018-03-06 DIAGNOSIS — E119 Type 2 diabetes mellitus without complications: Secondary | ICD-10-CM | POA: Diagnosis not present

## 2018-03-06 DIAGNOSIS — M159 Polyosteoarthritis, unspecified: Secondary | ICD-10-CM | POA: Diagnosis not present

## 2018-03-24 ENCOUNTER — Other Ambulatory Visit: Payer: Self-pay | Admitting: Rheumatology

## 2018-03-26 ENCOUNTER — Telehealth: Payer: Self-pay | Admitting: Rheumatology

## 2018-03-26 NOTE — Telephone Encounter (Signed)
PLQ eye exam form faxed to eye doctor.  

## 2018-03-26 NOTE — Telephone Encounter (Signed)
FYI: patient wants to let you know he has an appt for his Plaquenil Eye Exam set up for March 19,2020 at 3:30pm with Dr. Radford Pax. Please fax a exam form to Dr. Radford Pax for patient's appt FAX# [phone]

## 2018-03-26 NOTE — Telephone Encounter (Signed)
Last Visit: 01/21/18 Next visit: 05/31/18 Labs: 12/28/17 WNL PLQ Eye Exam 03/15/17 WNL  Patient reminded due to update PLQ eye exam. Patient states he just received a 30 day supply that was refilled through his PCP. Patient will set up PLQ eye exam.

## 2018-03-29 DIAGNOSIS — E7800 Pure hypercholesterolemia, unspecified: Secondary | ICD-10-CM | POA: Diagnosis not present

## 2018-03-29 DIAGNOSIS — E1165 Type 2 diabetes mellitus with hyperglycemia: Secondary | ICD-10-CM | POA: Diagnosis not present

## 2018-03-29 DIAGNOSIS — I10 Essential (primary) hypertension: Secondary | ICD-10-CM | POA: Diagnosis not present

## 2018-03-29 DIAGNOSIS — Z6832 Body mass index (BMI) 32.0-32.9, adult: Secondary | ICD-10-CM | POA: Diagnosis not present

## 2018-03-29 DIAGNOSIS — M329 Systemic lupus erythematosus, unspecified: Secondary | ICD-10-CM | POA: Diagnosis not present

## 2018-03-29 DIAGNOSIS — Z299 Encounter for prophylactic measures, unspecified: Secondary | ICD-10-CM | POA: Diagnosis not present

## 2018-04-02 DIAGNOSIS — E119 Type 2 diabetes mellitus without complications: Secondary | ICD-10-CM | POA: Diagnosis not present

## 2018-04-02 DIAGNOSIS — M159 Polyosteoarthritis, unspecified: Secondary | ICD-10-CM | POA: Diagnosis not present

## 2018-04-02 DIAGNOSIS — I10 Essential (primary) hypertension: Secondary | ICD-10-CM | POA: Diagnosis not present

## 2018-04-18 DIAGNOSIS — Z713 Dietary counseling and surveillance: Secondary | ICD-10-CM | POA: Diagnosis not present

## 2018-04-18 DIAGNOSIS — I10 Essential (primary) hypertension: Secondary | ICD-10-CM | POA: Diagnosis not present

## 2018-04-18 DIAGNOSIS — Z6832 Body mass index (BMI) 32.0-32.9, adult: Secondary | ICD-10-CM | POA: Diagnosis not present

## 2018-04-18 DIAGNOSIS — Z299 Encounter for prophylactic measures, unspecified: Secondary | ICD-10-CM | POA: Diagnosis not present

## 2018-04-18 DIAGNOSIS — E1165 Type 2 diabetes mellitus with hyperglycemia: Secondary | ICD-10-CM | POA: Diagnosis not present

## 2018-04-24 ENCOUNTER — Telehealth: Payer: Self-pay | Admitting: Rheumatology

## 2018-04-24 NOTE — Telephone Encounter (Signed)
Patient left a voicemail requesting a return call to discuss his medication.

## 2018-04-24 NOTE — Telephone Encounter (Signed)
Patient states that he has his prescription for PLQ filled at [facility]. Patient states they are not going to be able to fill it at this time. Patient advised to contact the [facility] to see if they may have it in stock. Patient advised to contact the office if he has trouble getting the prescription filled.

## 2018-05-11 DIAGNOSIS — I10 Essential (primary) hypertension: Secondary | ICD-10-CM | POA: Diagnosis not present

## 2018-05-11 DIAGNOSIS — E119 Type 2 diabetes mellitus without complications: Secondary | ICD-10-CM | POA: Diagnosis not present

## 2018-05-11 DIAGNOSIS — M159 Polyosteoarthritis, unspecified: Secondary | ICD-10-CM | POA: Diagnosis not present

## 2018-05-23 ENCOUNTER — Telehealth: Payer: Self-pay | Admitting: Rheumatology

## 2018-05-23 NOTE — Telephone Encounter (Signed)
Patient called checking on the last time he had labwork for HEP C.  Patient states he had tattoos approximately 45 years ago.  Patient requested a return call.

## 2018-05-23 NOTE — Progress Notes (Signed)
Virtual Visit via Telephone Note  I connected with Daryl Bell on 05/23/18 at 11:15 AM EDT by telephone and verified that I am speaking with the correct person using two identifiers.   I discussed the limitations, risks, security and privacy concerns of performing an evaluation and management service by telephone and the availability of in person appointments. I also discussed with the patient that there may be a patient responsible charge related to this service. The patient expressed understanding and agreed to proceed. This service was conducted via virtual visit.   The patient was located at home. I was located in my office.  Consent was obtained prior to the virtual visit and is aware of possible charges through their insurance for this visit.  The patient is an established patient.  Dr. Estanislado Pandy, MD conducted the virtual visit and Hazel Sams, PA-C acted as scribe during the service.  Office staff helped with scheduling follow up visits after the service was conducted.    CC: Medication monitoring   History of Present Illness: Patient is a 68 year old male with a past medical history of autoimmune disease and osteoarthritis.  He is taking PLQ 200 mg BID.  He recently had left shoulder joint pain, but his joint pain subsided 3 weeks ago.  He denies any other joint pain joint swelling.  He has intermittent right trochanteric bursitis. He has discomfort if he walks up an incline.  He denies any recent rashes.  He continues to have photosensitivity, and he wear sunscreen and gloves for protection.  He has eye dryness and mild mouth dryness.  Denies oral or nasal ulcerations. He denies any worsening fatigue.   Review of Systems  Constitutional: Negative for fever and malaise/fatigue.  Eyes: Negative for photophobia, pain, discharge and redness.       +Dry eyes  Respiratory: Negative for cough, shortness of breath and wheezing.   Cardiovascular: Negative for chest pain and palpitations.   Gastrointestinal: Negative for blood in stool, constipation and diarrhea.  Genitourinary: Negative for dysuria.  Musculoskeletal: Negative for back pain, joint pain, myalgias and neck pain.  Skin: Negative for rash.       +photosensitivity   Neurological: Negative for dizziness and headaches.  Psychiatric/Behavioral: Negative for depression. The patient is not nervous/anxious and does not have insomnia.       Observations/Objective: Physical Exam  Constitutional: He is oriented to person, place, and time.  Neurological: He is alert and oriented to person, place, and time.  Psychiatric: Mood, memory, affect and judgment normal.   Patient reports morning stiffness for 2 minutes.   Patient denies nocturnal pain.  Difficulty dressing/grooming: Denies Difficulty climbing stairs: Denies Difficulty getting out of chair: Denies Difficulty using hands for taps, buttons, cutlery, and/or writing: Denies   Assessment and Plan: Autoimmune disease ([place]) - Positive ANA, positive double-stranded DNA, history of photosensitivity: He has not had any recent signs or symptoms of a flare.  He is clinically doing well on Plaquenil 200 mg 1 tablet by mouth twice daily.  He takes Aspirin 81 mg po daily.  He has no joint pain or joint swelling currently.  He experienced left shoulder joint pain several weeks ago, which has resolved.  He has not had any recent rashes, but he continues to have photosensitivity.  We discussed the importance of wearing sunscreen and UV protective clothing.  He has no oral or nasal ulcerations.  Denies symptoms of Raynaud's.  He has chronic eye dryness and mild mouth dryness.  He will continue on Plaquenil 200 mg 1 tablet by mouth BID.  He is due to update lab work.  Future orders placed today.  He was advised to notify us if he develops new or worsening symptoms.  He will follow up in 3-4 months.   High risk medication use - Current regimen includes Plaquenil 200 mg twice daily.   Last Plaquenil eye exam normal on 03/15/2017.  He has an upcoming eye exam scheduled.  CBC and CMP were WNL on 12/28/17.  He is due to update lab work.  Future orders were placed today. We discussed the importance of social distancing and following the standard precautions recommended by the CDC.  Trochanteric bursitis of right hip-He has intermittent symptoms of right trochanteric bursitis.  His symptoms are exacerbated by walking up an incline.  Primary osteoarthritis of both hands-He has no joint pain or joint swelling at this time.  He has no difficulty with ADLs.  Joint protection and muscle strengthening were discussed. He takes ginger, tart cherry, tumeric, and omega 3 daily.    Follow Up Instructions: He will follow up in 3-4 months. Future orders were placed today.     I discussed the assessment and treatment plan with the patient. The patient was provided an opportunity to ask questions and all were answered. The patient agreed with the plan and demonstrated an understanding of the instructions.   The patient was advised to call back or seek an in-person evaluation if the symptoms worsen or if the condition fails to improve as anticipated.  I provided 25 minutes of non-Bell-to-Bell time during this encounter. Bo Merino, MD   Scribed by- Ofilia Neas, PA-C

## 2018-05-23 NOTE — Telephone Encounter (Signed)
Patient advised he had a Hepatitis C antibody test in 2017 and it was negative.

## 2018-05-31 ENCOUNTER — Other Ambulatory Visit: Payer: Self-pay

## 2018-05-31 ENCOUNTER — Encounter: Payer: Self-pay | Admitting: Rheumatology

## 2018-05-31 ENCOUNTER — Telehealth (INDEPENDENT_AMBULATORY_CARE_PROVIDER_SITE_OTHER): Payer: Medicare Other | Admitting: Rheumatology

## 2018-05-31 DIAGNOSIS — Z8679 Personal history of other diseases of the circulatory system: Secondary | ICD-10-CM

## 2018-05-31 DIAGNOSIS — M7061 Trochanteric bursitis, right hip: Secondary | ICD-10-CM

## 2018-05-31 DIAGNOSIS — M359 Systemic involvement of connective tissue, unspecified: Secondary | ICD-10-CM | POA: Diagnosis not present

## 2018-05-31 DIAGNOSIS — Z8719 Personal history of other diseases of the digestive system: Secondary | ICD-10-CM

## 2018-05-31 DIAGNOSIS — Z79899 Other long term (current) drug therapy: Secondary | ICD-10-CM

## 2018-05-31 DIAGNOSIS — M19041 Primary osteoarthritis, right hand: Secondary | ICD-10-CM

## 2018-05-31 DIAGNOSIS — M19042 Primary osteoarthritis, left hand: Secondary | ICD-10-CM | POA: Diagnosis not present

## 2018-05-31 DIAGNOSIS — N29 Other disorders of kidney and ureter in diseases classified elsewhere: Secondary | ICD-10-CM

## 2018-05-31 DIAGNOSIS — Z8639 Personal history of other endocrine, nutritional and metabolic disease: Secondary | ICD-10-CM

## 2018-06-12 DIAGNOSIS — I10 Essential (primary) hypertension: Secondary | ICD-10-CM | POA: Diagnosis not present

## 2018-06-12 DIAGNOSIS — M159 Polyosteoarthritis, unspecified: Secondary | ICD-10-CM | POA: Diagnosis not present

## 2018-06-12 DIAGNOSIS — E119 Type 2 diabetes mellitus without complications: Secondary | ICD-10-CM | POA: Diagnosis not present

## 2018-06-14 ENCOUNTER — Telehealth: Payer: Self-pay | Admitting: Rheumatology

## 2018-06-14 ENCOUNTER — Other Ambulatory Visit: Payer: Self-pay

## 2018-06-14 DIAGNOSIS — E1165 Type 2 diabetes mellitus with hyperglycemia: Secondary | ICD-10-CM | POA: Diagnosis not present

## 2018-06-14 DIAGNOSIS — M329 Systemic lupus erythematosus, unspecified: Secondary | ICD-10-CM | POA: Diagnosis not present

## 2018-06-14 DIAGNOSIS — M359 Systemic involvement of connective tissue, unspecified: Secondary | ICD-10-CM

## 2018-06-14 DIAGNOSIS — Z79899 Other long term (current) drug therapy: Secondary | ICD-10-CM

## 2018-06-14 DIAGNOSIS — G8929 Other chronic pain: Secondary | ICD-10-CM | POA: Diagnosis not present

## 2018-06-14 DIAGNOSIS — I10 Essential (primary) hypertension: Secondary | ICD-10-CM | POA: Diagnosis not present

## 2018-06-14 DIAGNOSIS — Z299 Encounter for prophylactic measures, unspecified: Secondary | ICD-10-CM | POA: Diagnosis not present

## 2018-06-14 DIAGNOSIS — Z6835 Body mass index (BMI) 35.0-35.9, adult: Secondary | ICD-10-CM | POA: Diagnosis not present

## 2018-06-14 NOTE — Telephone Encounter (Signed)
Noted  

## 2018-06-14 NOTE — Telephone Encounter (Signed)
Daryl Bell called to let Dr. Estanislado Pandy know that his Plaquenil eye exam was rescheduled for 08/01/18.  Daryl Bell states he will come in this afternoon 06/14/18 for labwork.

## 2018-06-15 LAB — COMPLETE METABOLIC PANEL WITH GFR
AG Ratio: 1.9 (calc) (ref 1.0–2.5)
ALT: 16 U/L (ref 9–46)
AST: 17 U/L (ref 10–35)
Albumin: 4.4 g/dL (ref 3.6–5.1)
Alkaline phosphatase (APISO): 41 U/L (ref 35–144)
BUN: 9 mg/dL (ref 7–25)
CO2: 34 mmol/L — ABNORMAL HIGH (ref 20–32)
Calcium: 9.5 mg/dL (ref 8.6–10.3)
Chloride: 98 mmol/L (ref 98–110)
Creat: 0.89 mg/dL (ref 0.70–1.25)
GFR, Est African American: 104 mL/min/{1.73_m2} (ref >=60)
GFR, Est Non African American: 90 mL/min/{1.73_m2} (ref >=60)
Globulin: 2.3 g/dL (calc) (ref 1.9–3.7)
Glucose, Bld: 88 mg/dL (ref 65–99)
Potassium: 4.4 mmol/L (ref 3.5–5.3)
Sodium: 141 mmol/L (ref 135–146)
Total Bilirubin: 0.4 mg/dL (ref 0.2–1.2)
Total Protein: 6.7 g/dL (ref 6.1–8.1)

## 2018-06-15 LAB — CBC WITH DIFFERENTIAL/PLATELET
Absolute Monocytes: 859 cells/uL (ref 200–950)
Basophils Absolute: 119 cells/uL (ref 0–200)
Basophils Relative: 1.4 %
Eosinophils Absolute: 230 cells/uL (ref 15–500)
Eosinophils Relative: 2.7 %
HCT: 45 % (ref 38.5–50.0)
Hemoglobin: 15.3 g/dL (ref 13.2–17.1)
Lymphs Abs: 2720 cells/uL (ref 850–3900)
MCH: 31 pg (ref 27.0–33.0)
MCHC: 34 g/dL (ref 32.0–36.0)
MCV: 91.3 fL (ref 80.0–100.0)
MPV: 11.4 fL (ref 7.5–12.5)
Monocytes Relative: 10.1 %
Neutro Abs: 4573 cells/uL (ref 1500–7800)
Neutrophils Relative %: 53.8 %
Platelets: 234 10*3/uL (ref 140–400)
RBC: 4.93 10*6/uL (ref 4.20–5.80)
RDW: 12.3 % (ref 11.0–15.0)
Total Lymphocyte: 32 %
WBC: 8.5 10*3/uL (ref 3.8–10.8)

## 2018-06-15 LAB — ANTI-DNA ANTIBODY, DOUBLE-STRANDED: ds DNA Ab: 10 IU/mL — ABNORMAL HIGH

## 2018-06-15 LAB — URINALYSIS, ROUTINE W REFLEX MICROSCOPIC
Bilirubin Urine: NEGATIVE
Glucose, UA: NEGATIVE
Hgb urine dipstick: NEGATIVE
Ketones, ur: NEGATIVE
Leukocytes,Ua: NEGATIVE
Nitrite: NEGATIVE
Protein, ur: NEGATIVE
Specific Gravity, Urine: 1.009 (ref 1.001–1.03)
pH: 7 (ref 5.0–8.0)

## 2018-06-15 LAB — C3 AND C4
C3 Complement: 118 mg/dL (ref 82–185)
C4 Complement: 23 mg/dL (ref 15–53)

## 2018-06-15 LAB — SEDIMENTATION RATE: Sed Rate: 6 mm/h (ref 0–20)

## 2018-06-15 LAB — VITAMIN D 25 HYDROXY (VIT D DEFICIENCY, FRACTURES): Vit D, 25-Hydroxy: 67 ng/mL (ref 30–100)

## 2018-06-15 NOTE — Progress Notes (Signed)
Vitamin D is within desirable range.  Sed rate WNL.  Complements WNL. UA WNL.  CBC and CMP WNL.

## 2018-06-19 NOTE — Progress Notes (Signed)
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  Please advised patient to notify us if he develops any signs or symptoms of a flare.

## 2018-07-09 DIAGNOSIS — E119 Type 2 diabetes mellitus without complications: Secondary | ICD-10-CM | POA: Diagnosis not present

## 2018-07-09 DIAGNOSIS — M159 Polyosteoarthritis, unspecified: Secondary | ICD-10-CM | POA: Diagnosis not present

## 2018-07-09 DIAGNOSIS — I10 Essential (primary) hypertension: Secondary | ICD-10-CM | POA: Diagnosis not present

## 2018-07-26 DIAGNOSIS — E11319 Type 2 diabetes mellitus with unspecified diabetic retinopathy without macular edema: Secondary | ICD-10-CM | POA: Diagnosis not present

## 2018-08-01 DIAGNOSIS — Z79899 Other long term (current) drug therapy: Secondary | ICD-10-CM | POA: Diagnosis not present

## 2018-08-01 LAB — HM DIABETES EYE EXAM

## 2018-08-08 ENCOUNTER — Telehealth: Payer: Self-pay | Admitting: Rheumatology

## 2018-08-08 DIAGNOSIS — M159 Polyosteoarthritis, unspecified: Secondary | ICD-10-CM | POA: Diagnosis not present

## 2018-08-08 DIAGNOSIS — E119 Type 2 diabetes mellitus without complications: Secondary | ICD-10-CM | POA: Diagnosis not present

## 2018-08-08 DIAGNOSIS — I10 Essential (primary) hypertension: Secondary | ICD-10-CM | POA: Diagnosis not present

## 2018-08-08 NOTE — Telephone Encounter (Signed)
Patient wants to give blood, and wouls like to know hisa blood type. Can you please call him to advise him of this?

## 2018-08-08 NOTE — Telephone Encounter (Signed)
Patient advised we do not have his blood type as that is not something that we test for that.

## 2018-08-31 DIAGNOSIS — Z789 Other specified health status: Secondary | ICD-10-CM | POA: Diagnosis not present

## 2018-08-31 DIAGNOSIS — E669 Obesity, unspecified: Secondary | ICD-10-CM | POA: Diagnosis not present

## 2018-08-31 DIAGNOSIS — Z299 Encounter for prophylactic measures, unspecified: Secondary | ICD-10-CM | POA: Diagnosis not present

## 2018-08-31 DIAGNOSIS — M329 Systemic lupus erythematosus, unspecified: Secondary | ICD-10-CM | POA: Diagnosis not present

## 2018-08-31 DIAGNOSIS — I10 Essential (primary) hypertension: Secondary | ICD-10-CM | POA: Diagnosis not present

## 2018-09-05 DIAGNOSIS — M159 Polyosteoarthritis, unspecified: Secondary | ICD-10-CM | POA: Diagnosis not present

## 2018-09-05 DIAGNOSIS — E119 Type 2 diabetes mellitus without complications: Secondary | ICD-10-CM | POA: Diagnosis not present

## 2018-09-05 DIAGNOSIS — I10 Essential (primary) hypertension: Secondary | ICD-10-CM | POA: Diagnosis not present

## 2018-09-27 NOTE — Progress Notes (Signed)
Office Visit Note  Patient: Daryl Bell             Date of Birth: 1950/04/13           MRN: [id number]             PCP: Monico Blitz, MD Referring: Monico Blitz, MD Visit Date: 10/11/2018 Occupation: @GUAROCC @  Subjective:  Trochanteric bursitis bilaterally   History of Present Illness: Daryl Bell is a 68 y.o. male with history of autoimmune disease and osteoarthritis.  He is taking Plaquenil 200 mg 1 tablet by mouth twice daily.  He has not missed any doses recently.  He states he had a PLQ eye exam in July 2020.  He denies any recent flares.  He presents today with bilateral trochanteric bursitis.  He has been experiencing pain at night and stiffness in the morning.  He states the pain and stiffness subsides after taking ibuprofen 800 mg every morning and occasionally at bedtime.  He is having left CMC joint pain.  He had a cortisone injection performed by Dr. Amedeo Plenty in October 2019.  He states the pain resolved until 2 months ago, so he plans on scheduling an appointment with Dr. Amedeo Plenty for a repeat injection.  He is having persistent left shoulder joint pain.  He states he was going to follow up with Dr. Onnie Graham, but he has not made an appointment yet.  He denies any recent rashes but continues to have photosensitivity.  He denies any sores in his mouth or nose.  He states he has dry mouth but no dry eye dryness.  He has not had any symptoms of Raynaud's.  He denies any fevers, swollen lymph nodes, or fatigue.  He denies any shortness of breath or palpitations.    Activities of Daily Living:  Patient reports morning stiffness for 5-10 minutes.   Patient Reports nocturnal pain.  Difficulty dressing/grooming: Denies Difficulty climbing stairs: Reports Difficulty getting out of chair: Reports Difficulty using hands for taps, buttons, cutlery, and/or writing: Denies  Review of Systems  Constitutional: Negative for fatigue and night sweats.  HENT: Positive for mouth  dryness. Negative for mouth sores and nose dryness.   Eyes: Negative for redness and dryness.  Respiratory: Negative for cough, hemoptysis, shortness of breath and difficulty breathing.   Cardiovascular: Negative for chest pain, palpitations, hypertension, irregular heartbeat and swelling in legs/feet.  Gastrointestinal: Negative for blood in stool, constipation and diarrhea.  Endocrine: Negative for increased urination.  Genitourinary: Negative for painful urination.  Musculoskeletal: Positive for arthralgias, joint pain and morning stiffness. Negative for joint swelling, myalgias, muscle weakness, muscle tenderness and myalgias.  Skin: Positive for sensitivity to sunlight. Negative for color change, rash, hair loss, nodules/bumps, skin tightness and ulcers.  Allergic/Immunologic: Negative for susceptible to infections.  Neurological: Negative for dizziness, fainting, memory loss, night sweats and weakness.  Hematological: Negative for swollen glands.  Psychiatric/Behavioral: Negative for depressed mood and sleep disturbance. The patient is not nervous/anxious.     PMFS History:  Patient Active Problem List   Diagnosis Date Noted   Neck pain 03/31/2016   Acute pain of left shoulder 03/31/2016   ANA positive 12/29/2015   Ds DNA antibody positive 12/29/2015   Photosensitivity 12/29/2015   High risk medication use 12/29/2015   Other dysphagia 02/13/2013   Encounter for screening colonoscopy 02/13/2013    Past Medical History:  Diagnosis Date   Arthritis    Diabetes ([place])    GERD (gastroesophageal reflux disease)  Hypercholesterolemia    Hypertension    Kidney calculus     Family History  Problem Relation Age of Onset   Dementia Father    Colon cancer Neg Hx    Past Surgical History:  Procedure Laterality Date   COLONOSCOPY, ESOPHAGOGASTRODUODENOSCOPY (EGD) AND ESOPHAGEAL DILATION N/A 03/07/2013   Procedure: COLONOSCOPY, ESOPHAGOGASTRODUODENOSCOPY (EGD) AND  ESOPHAGEAL DILATION;  Surgeon: Daneil Dolin, MD;  Location: [facility];  Service: Endoscopy;  Laterality: N/A;  11:15   ESOPHAGOGASTRODUODENOSCOPY     remote past in the 1990s with dilation   KIDNEY STONE SURGERY  2011   Social History   Social History Narrative   Not on file   Immunization History  Administered Date(s) Administered   Zoster Recombinat (Shingrix) 06/25/2017     Objective: Vital Signs: BP 122/79 (BP Location: Left Arm, Patient Position: Sitting, Cuff Size: Normal)    Pulse 77    Resp 14    Ht 5\' 7"  (1.702 m)    Wt 217 lb (98.4 kg)    BMI 33.99 kg/m    Physical Exam Vitals signs and nursing note reviewed.  Constitutional:      Appearance: He is well-developed.  HENT:     Head: Normocephalic and atraumatic.  Eyes:     Conjunctiva/sclera: Conjunctivae normal.     Pupils: Pupils are equal, round, and reactive to light.  Neck:     Musculoskeletal: Normal range of motion and neck supple.  Cardiovascular:     Rate and Rhythm: Normal rate and regular rhythm.     Heart sounds: Normal heart sounds.  Pulmonary:     Effort: Pulmonary effort is normal.     Breath sounds: Normal breath sounds.  Abdominal:     General: Bowel sounds are normal.     Palpations: Abdomen is soft.  Skin:    General: Skin is warm and dry.     Capillary Refill: Capillary refill takes less than 2 seconds.  Neurological:     Mental Status: He is alert and oriented to person, place, and time.  Psychiatric:        Behavior: Behavior normal.      Musculoskeletal Exam: C-spine, thoracic spine, lumbar spine good range of motion.  No midline spinal tenderness.  No SI joint tenderness.  Shoulder joints, elbow joints, wrist joints, MCPs, PIPs, DIPs good range of motion no synovitis.  Discomfort with left shoulder range of motion.  He has [place] joint prominence bilaterally.  PIP and DIP synovial thickening consistent with osteoarthritis of both hands.  Hip joints, knee joints, ankle joints,  MTPs, PIPs, DIPs good range of motion with no synovitis.  No warmth or effusion of bilateral knee joints.  No tenderness or swelling of ankle joints.  CDAI Exam: CDAI Score: -- Patient Global: --; Provider Global: -- Swollen: --; Tender: -- Joint Exam   No joint exam has been documented for this visit   There is currently no information documented on the homunculus. Go to the Rheumatology activity and complete the homunculus joint exam.  Investigation: No additional findings.  Imaging: No results found.  Recent Labs: Lab Results  Component Value Date   WBC 8.5 06/14/2018   HGB 15.3 06/14/2018   PLT 234 06/14/2018   NA 141 06/14/2018   K 4.4 06/14/2018   CL 98 06/14/2018   CO2 34 (H) 06/14/2018   GLUCOSE 88 06/14/2018   BUN 9 06/14/2018   CREATININE 0.89 06/14/2018   BILITOT 0.4 06/14/2018   ALKPHOS 46  09/01/2016   AST 17 06/14/2018   ALT 16 06/14/2018   PROT 6.7 06/14/2018   ALBUMIN 4.2 09/01/2016   CALCIUM 9.5 06/14/2018   GFRAA 104 06/14/2018    Speciality Comments: PLQ Eye Exam 03/15/17 WNL @ [facility] Follow up in 6 months  Procedures:  No procedures performed Allergies: Patient has no known allergies.   Assessment / Plan:     Visit Diagnoses: Autoimmune disease ([place]) - Positive ANA, positive double-stranded DNA, history of photosensitivity: He has not had any signs or symptoms of a flare recently.  He is clinically doing well on Plaquenil 200 mg 1 tablet by mouth twice daily.  He has no synovitis on exam.  He has osteoarthritic changes in both hands which cause discomfort and stiffness.  He has not had any recent rashes but continues to have photosensitivity.  We discussed the importance of wearing SPF 50 sunscreen on a daily basis and trying to avoid the prime hours of sunlight.  He has not had any symptoms of Raynaud's.  No digital ulcerations or signs of gangrene were noted.  He experiences dry mouth occasionally but has not had any eye dryness.  He denies  any oral or nasal ulcerations.  He has not had any worsening fatigue, fever, or swollen lymph nodes.  He denies any shortness of breath or palpitations.  He is clinically doing well on Plaquenil 200 mg 1 tablet by mouth twice daily.  A refill was sent to the pharmacy today.  We will check autoimmune lab work today.  He was advised to notify us if he develops new or worsening symptoms.  He will follow-up in the office in 5 months.- Plan: CBC with Differential/Platelet, COMPLETE METABOLIC PANEL WITH GFR, Urinalysis, Routine w reflex microscopic, Anti-DNA antibody, double-stranded, C3 and C4, Sedimentation rate, hydroxychloroquine (PLAQUENIL) 200 MG tablet  High risk medication use - Plaquenil 200 mg 1 tablet by mouth twice daily.  He had an updated Plaquenil eye exam in July 2020.  He called his eye doctor and will have the results faxed to our office.  CBC and CMP will be drawn today to monitor for drug toxicity.  He will have lab work every 5 months.- Plan: CBC with Differential/Platelet, COMPLETE METABOLIC PANEL WITH GFR  Primary osteoarthritis of both hands: He has PIP and DIP synovial thickening consistent with osteoarthritis of both hands.  He has Sloan joint prominence bilaterally.  He has no synovitis or tenderness on exam.  He has complete fist formation bilaterally.  Joint protection and muscle strengthening were discussed.  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  He is currently wearing a left CMC joint brace.  He had a cortisone injection performed in October 2019 by Dr. Amedeo Plenty.  He states that the discomfort resolved until about 2 months ago.  He is planning on following up with Dr. Amedeo Plenty for a repeat injection.  Trochanteric bursitis of both hips: He has tenderness over bilateral trochanter bursa on exam.  He has been experiencing nocturnal pain and experiences morning stiffness bilaterally.  He takes ibuprofen  800 mg twice daily PRN for pain relief.  He was given a handout of exercises to perform.  He declined a cortisone injection at this time.  Chronic left shoulder pain: He has chronic left shoulder joint pain.  He has good range of motion with discomfort on exam.  He had a left shoulder joint cortisone injection on 08/17/2017.  He is planning on following up with Dr. Onnie Graham or Dr. Veverly Fells at [facility].  Other medical conditions are listed as follows:  History of hypertension  Vitiligo  Renal calcinosis  History of gastroesophageal reflux (GERD)  History of diabetes mellitus  History of hypercholesterolemia  Orders: Orders Placed This Encounter  Procedures   CBC with Differential/Platelet   COMPLETE METABOLIC PANEL WITH GFR   Urinalysis, Routine w reflex microscopic   Anti-DNA antibody, double-stranded   C3 and C4   Sedimentation rate   Meds ordered this encounter  Medications   hydroxychloroquine (PLAQUENIL) 200 MG tablet    Sig: Take 1 tablet (200 mg total) by mouth 2 (two) times daily.    Dispense:  180 tablet    Refill:  0      Follow-Up Instructions: Return in about 5 months (around 03/13/2019) for Autoimmune Disease, Osteoarthritis.   Ofilia Neas, PA-C   I examined and evaluated the patient with Hazel Sams PA.  Patient has not been having any flares of her lupus.  He has been taking hydroxychloroquine on a regular basis.  He had no synovitis on examination.  He does have some underlying osteoarthritis which causes discomfort.  Joint protection was discussed.  IT band exercises were discussed.  The plan of care was discussed as noted above.  Bo Merino, MD  Note - This record has been created using Editor, commissioning.  Chart creation errors have been sought, but may not always  have been located. Such creation errors do not reflect on  the standard of medical care.

## 2018-10-03 DIAGNOSIS — M159 Polyosteoarthritis, unspecified: Secondary | ICD-10-CM | POA: Diagnosis not present

## 2018-10-03 DIAGNOSIS — I10 Essential (primary) hypertension: Secondary | ICD-10-CM | POA: Diagnosis not present

## 2018-10-03 DIAGNOSIS — E119 Type 2 diabetes mellitus without complications: Secondary | ICD-10-CM | POA: Diagnosis not present

## 2018-10-11 ENCOUNTER — Other Ambulatory Visit: Payer: Self-pay

## 2018-10-11 ENCOUNTER — Encounter: Payer: Self-pay | Admitting: Rheumatology

## 2018-10-11 ENCOUNTER — Ambulatory Visit (INDEPENDENT_AMBULATORY_CARE_PROVIDER_SITE_OTHER): Payer: Medicare Other | Admitting: Rheumatology

## 2018-10-11 VITALS — BP 122/79 | HR 77 | Resp 14 | Ht 67.0 in | Wt 217.0 lb

## 2018-10-11 DIAGNOSIS — M19041 Primary osteoarthritis, right hand: Secondary | ICD-10-CM | POA: Diagnosis not present

## 2018-10-11 DIAGNOSIS — M25512 Pain in left shoulder: Secondary | ICD-10-CM | POA: Diagnosis not present

## 2018-10-11 DIAGNOSIS — M19042 Primary osteoarthritis, left hand: Secondary | ICD-10-CM

## 2018-10-11 DIAGNOSIS — Z79899 Other long term (current) drug therapy: Secondary | ICD-10-CM

## 2018-10-11 DIAGNOSIS — L80 Vitiligo: Secondary | ICD-10-CM | POA: Diagnosis not present

## 2018-10-11 DIAGNOSIS — M7061 Trochanteric bursitis, right hip: Secondary | ICD-10-CM | POA: Diagnosis not present

## 2018-10-11 DIAGNOSIS — N29 Other disorders of kidney and ureter in diseases classified elsewhere: Secondary | ICD-10-CM

## 2018-10-11 DIAGNOSIS — M359 Systemic involvement of connective tissue, unspecified: Secondary | ICD-10-CM | POA: Diagnosis not present

## 2018-10-11 DIAGNOSIS — Z8679 Personal history of other diseases of the circulatory system: Secondary | ICD-10-CM

## 2018-10-11 DIAGNOSIS — Z8639 Personal history of other endocrine, nutritional and metabolic disease: Secondary | ICD-10-CM | POA: Diagnosis not present

## 2018-10-11 DIAGNOSIS — Z8719 Personal history of other diseases of the digestive system: Secondary | ICD-10-CM | POA: Diagnosis not present

## 2018-10-11 DIAGNOSIS — M7062 Trochanteric bursitis, left hip: Secondary | ICD-10-CM

## 2018-10-11 DIAGNOSIS — M1812 Unilateral primary osteoarthritis of first carpometacarpal joint, left hand: Secondary | ICD-10-CM

## 2018-10-11 DIAGNOSIS — G8929 Other chronic pain: Secondary | ICD-10-CM

## 2018-10-11 MED ORDER — HYDROXYCHLOROQUINE SULFATE 200 MG PO TABS: 200.0000 mg | ORAL_TABLET | Freq: Two times a day (BID) | ORAL | 0 refills | Status: DC

## 2018-10-11 NOTE — Patient Instructions (Signed)
Hip Bursitis Rehab Ask your health care provider which exercises are safe for you. Do exercises exactly as told by your health care provider and adjust them as directed. It is normal to feel mild stretching, pulling, tightness, or discomfort as you do these exercises. Stop right away if you feel sudden pain or your pain gets worse. Do not begin these exercises until told by your health care provider. Stretching exercise This exercise warms up your muscles and joints and improves the movement and flexibility of your hip. This exercise also helps to relieve pain and stiffness. Iliotibial band stretch An iliotibial band is a strong band of muscle tissue that runs from the outer side of your hip to the outer side of your thigh and knee. 1. Lie on your side with your left / right leg in the top position. 2. Bend your left / right knee and grab your ankle. Stretch out your bottom arm to help you balance. 3. Slowly bring your knee back so your thigh is behind your body. 4. Slowly lower your knee toward the floor until you feel a gentle stretch on the outside of your left / right thigh. If you do not feel a stretch and your knee will not fall farther, place the heel of your other foot on top of your knee and pull your knee down toward the floor with your foot. 5. Hold this position for __________ seconds. 6. Slowly return to the starting position. Repeat __________ times. Complete this exercise __________ times a day. Strengthening exercises These exercises build strength and endurance in your hip and pelvis. Endurance is the ability to use your muscles for a long time, even after they get tired. Bridge This exercise strengthens the muscles that move your thigh backward (hip extensors). 1. Lie on your back on a firm surface with your knees bent and your feet flat on the floor. 2. Tighten your buttocks muscles and lift your buttocks off the floor until your trunk is level with your thighs. ? Do not arch  your back. ? You should feel the muscles working in your buttocks and the back of your thighs. If you do not feel these muscles, slide your feet 1-2 inches (2.5-5 cm) farther away from your buttocks. ? If this exercise is too easy, try doing it with your arms crossed over your chest. 3. Hold this position for __________ seconds. 4. Slowly lower your hips to the starting position. 5. Let your muscles relax completely after each repetition. Repeat __________ times. Complete this exercise __________ times a day. Squats This exercise strengthens the muscles in front of your thigh and knee (quadriceps). 1. Stand in front of a table, with your feet and knees pointing straight ahead. You may rest your hands on the table for balance but not for support. 2. Slowly bend your knees and lower your hips like you are going to sit in a chair. ? Keep your weight over your heels, not over your toes. ? Keep your lower legs upright so they are parallel with the table legs. ? Do not let your hips go lower than your knees. ? Do not bend lower than told by your health care provider. ? If your hip pain increases, do not bend as low. 3. Hold the squat position for __________ seconds. 4. Slowly push with your legs to return to standing. Do not use your hands to pull yourself to standing. Repeat __________ times. Complete this exercise __________ times a day. Hip hike 1. Stand   sideways on a bottom step. Stand on your left / right leg with your other foot unsupported next to the step. You can hold on to the railing or wall for balance if needed. 2. Keep your knees straight and your torso square. Then lift your left / right hip up toward the ceiling. 3. Hold this position for __________ seconds. 4. Slowly let your left / right hip lower toward the floor, past the starting position. Your foot should get closer to the floor. Do not lean or bend your knees. Repeat __________ times. Complete this exercise __________ times a  day. Single leg stand 1. Without shoes, stand near a railing or in a doorway. You may hold on to the railing or door frame as needed for balance. 2. Squeeze your left / right buttock muscles, then lift up your other foot. ? Do not let your left / right hip push out to the side. ? It is helpful to stand in front of a mirror for this exercise so you can watch your hip. 3. Hold this position for __________ seconds. Repeat __________ times. Complete this exercise __________ times a day. This information is not intended to replace advice given to you by your health care provider. Make sure you discuss any questions you have with your health care provider. Document Released: 02/18/2004 Document Revised: 05/07/2018 Document Reviewed: 05/07/2018 Elsevier Patient Education  2020 [employer]  Iliotibial Band Syndrome Rehab Ask your health care provider which exercises are safe for you. Do exercises exactly as told by your health care provider and adjust them as directed. It is normal to feel mild stretching, pulling, tightness, or discomfort as you do these exercises. Stop right away if you feel sudden pain or your pain gets significantly worse. Do not begin these exercises until told by your health care provider. Stretching and range-of-motion exercises These exercises warm up your muscles and joints and improve the movement and flexibility of your hip and pelvis. Quadriceps stretch, prone  1. Lie on your abdomen on a firm surface, such as a bed or padded floor (prone position). 2. Bend your left / right knee and reach back to hold your ankle or pant leg. If you cannot reach your ankle or pant leg, loop a belt around your foot and grab the belt instead. 3. Gently pull your heel toward your buttocks. Your knee should not slide out to the side. You should feel a stretch in the front of your thigh and knee (quadriceps). 4. Hold this position for __________ seconds. Repeat __________ times. Complete this  exercise __________ times a day. Iliotibial band stretch An iliotibial band is a strong band of muscle tissue that runs from the outer side of your hip to the outer side of your thigh and knee. 1. Lie on your side with your left / right leg in the top position. 2. Bend both of your knees and grab your left / right ankle. Stretch out your bottom arm to help you balance. 3. Slowly bring your top knee back so your thigh goes behind your trunk. 4. Slowly lower your top leg toward the floor until you feel a gentle stretch on the outside of your left / right hip and thigh. If you do not feel a stretch and your knee will not fall farther, place the heel of your other foot on top of your knee and pull your knee down toward the floor with your foot. 5. Hold this position for __________ seconds. Repeat __________ times.   Complete this exercise __________ times a day. Strengthening exercises These exercises build strength and endurance in your hip and pelvis. Endurance is the ability to use your muscles for a long time, even after they get tired. Straight leg raises, side-lying This exercise strengthens the muscles that rotate the leg at the hip and move it away from your body (hip abductors). 1. Lie on your side with your left / right leg in the top position. Lie so your head, shoulder, hip, and knee line up. You may bend your bottom knee to help you balance. 2. Roll your hips slightly forward so your hips are stacked directly over each other and your left / right knee is facing forward. 3. Tense the muscles in your outer thigh and lift your top leg 4-6 inches (10-15 cm). 4. Hold this position for __________ seconds. 5. Slowly return to the starting position. Let your muscles relax completely before doing another repetition. Repeat __________ times. Complete this exercise __________ times a day. Leg raises, prone This exercise strengthens the muscles that move the hips (hip extensors). 1. Lie on your  abdomen on your bed or a firm surface. You can put a pillow under your hips if that is more comfortable for your lower back. 2. Bend your left / right knee so your foot is straight up in the air. 3. Squeeze your buttocks muscles and lift your left / right thigh off the bed. Do not let your back arch. 4. Tense your thigh muscle as hard as you can without increasing any knee pain. 5. Hold this position for __________ seconds. 6. Slowly lower your leg to the starting position and allow it to relax completely. Repeat __________ times. Complete this exercise __________ times a day. Hip hike 1. Stand sideways on a bottom step. Stand on your left / right leg with your other foot unsupported next to the step. You can hold on to the railing or wall for balance if needed. 2. Keep your knees straight and your torso square. Then lift your left / right hip up toward the ceiling. 3. Slowly let your left / right hip lower toward the floor, past the starting position. Your foot should get closer to the floor. Do not lean or bend your knees. Repeat __________ times. Complete this exercise __________ times a day. This information is not intended to replace advice given to you by your health care provider. Make sure you discuss any questions you have with your health care provider. Document Released: 01/10/2005 Document Revised: 05/03/2018 Document Reviewed: 11/01/2017 Elsevier Patient Education  2020 [employer] Inc[employer]  

## 2018-10-12 LAB — CBC WITH DIFFERENTIAL/PLATELET
Absolute Monocytes: 893 cells/uL (ref 200–950)
Basophils Absolute: 121 cells/uL (ref 0–200)
Basophils Relative: 1.3 %
Eosinophils Absolute: 260 cells/uL (ref 15–500)
Eosinophils Relative: 2.8 %
HCT: 44.8 % (ref 38.5–50.0)
Hemoglobin: 15 g/dL (ref 13.2–17.1)
Lymphs Abs: 2957 cells/uL (ref 850–3900)
MCH: 30.5 pg (ref 27.0–33.0)
MCHC: 33.5 g/dL (ref 32.0–36.0)
MCV: 91.2 fL (ref 80.0–100.0)
MPV: 11.7 fL (ref 7.5–12.5)
Monocytes Relative: 9.6 %
Neutro Abs: 5069 cells/uL (ref 1500–7800)
Neutrophils Relative %: 54.5 %
Platelets: 259 10*3/uL (ref 140–400)
RBC: 4.91 10*6/uL (ref 4.20–5.80)
RDW: 12.5 % (ref 11.0–15.0)
Total Lymphocyte: 31.8 %
WBC: 9.3 10*3/uL (ref 3.8–10.8)

## 2018-10-12 LAB — ANTI-DNA ANTIBODY, DOUBLE-STRANDED: ds DNA Ab: 9 IU/mL — ABNORMAL HIGH

## 2018-10-12 LAB — URINALYSIS, ROUTINE W REFLEX MICROSCOPIC
Bilirubin Urine: NEGATIVE
Glucose, UA: NEGATIVE
Hgb urine dipstick: NEGATIVE
Ketones, ur: NEGATIVE
Leukocytes,Ua: NEGATIVE
Nitrite: NEGATIVE
Protein, ur: NEGATIVE
Specific Gravity, Urine: 1.018 (ref 1.001–1.03)
pH: 5.5 (ref 5.0–8.0)

## 2018-10-12 LAB — COMPLETE METABOLIC PANEL WITH GFR
AG Ratio: 2 (calc) (ref 1.0–2.5)
ALT: 18 U/L (ref 9–46)
AST: 19 U/L (ref 10–35)
Albumin: 4.3 g/dL (ref 3.6–5.1)
Alkaline phosphatase (APISO): 39 U/L (ref 35–144)
BUN: 13 mg/dL (ref 7–25)
CO2: 31 mmol/L (ref 20–32)
Calcium: 9.8 mg/dL (ref 8.6–10.3)
Chloride: 101 mmol/L (ref 98–110)
Creat: 0.91 mg/dL (ref 0.70–1.25)
GFR, Est African American: 101 mL/min/{1.73_m2} (ref >=60)
GFR, Est Non African American: 88 mL/min/{1.73_m2} (ref >=60)
Globulin: 2.1 g/dL (calc) (ref 1.9–3.7)
Glucose, Bld: 88 mg/dL (ref 65–99)
Potassium: 5.1 mmol/L (ref 3.5–5.3)
Sodium: 144 mmol/L (ref 135–146)
Total Bilirubin: 0.4 mg/dL (ref 0.2–1.2)
Total Protein: 6.4 g/dL (ref 6.1–8.1)

## 2018-10-12 LAB — C3 AND C4
C3 Complement: 123 mg/dL (ref 82–185)
C4 Complement: 23 mg/dL (ref 15–53)

## 2018-10-12 LAB — SEDIMENTATION RATE: Sed Rate: 6 mm/h (ref 0–20)

## 2018-10-12 NOTE — Progress Notes (Signed)
Complements WNL.  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  Labs do not reveal findings consistent with a flare.

## 2018-10-12 NOTE — Progress Notes (Signed)
CBC and CMP WNL.  Sed rate WNL.  UA WNL

## 2018-10-17 DIAGNOSIS — Z7189 Other specified counseling: Secondary | ICD-10-CM | POA: Diagnosis not present

## 2018-10-17 DIAGNOSIS — Z1331 Encounter for screening for depression: Secondary | ICD-10-CM | POA: Diagnosis not present

## 2018-10-17 DIAGNOSIS — Z1339 Encounter for screening examination for other mental health and behavioral disorders: Secondary | ICD-10-CM | POA: Diagnosis not present

## 2018-10-17 DIAGNOSIS — Z0000 Encounter for general adult medical examination without abnormal findings: Secondary | ICD-10-CM | POA: Diagnosis not present

## 2018-10-17 DIAGNOSIS — Z1211 Encounter for screening for malignant neoplasm of colon: Secondary | ICD-10-CM | POA: Diagnosis not present

## 2018-10-17 DIAGNOSIS — I10 Essential (primary) hypertension: Secondary | ICD-10-CM | POA: Diagnosis not present

## 2018-10-17 DIAGNOSIS — E1165 Type 2 diabetes mellitus with hyperglycemia: Secondary | ICD-10-CM | POA: Diagnosis not present

## 2018-10-17 DIAGNOSIS — M329 Systemic lupus erythematosus, unspecified: Secondary | ICD-10-CM | POA: Diagnosis not present

## 2018-10-17 DIAGNOSIS — Z6831 Body mass index (BMI) 31.0-31.9, adult: Secondary | ICD-10-CM | POA: Diagnosis not present

## 2018-10-17 DIAGNOSIS — R5383 Other fatigue: Secondary | ICD-10-CM | POA: Diagnosis not present

## 2018-10-17 DIAGNOSIS — E7800 Pure hypercholesterolemia, unspecified: Secondary | ICD-10-CM | POA: Diagnosis not present

## 2018-10-17 DIAGNOSIS — Z299 Encounter for prophylactic measures, unspecified: Secondary | ICD-10-CM | POA: Diagnosis not present

## 2018-10-30 DIAGNOSIS — I10 Essential (primary) hypertension: Secondary | ICD-10-CM | POA: Diagnosis not present

## 2018-10-30 DIAGNOSIS — E119 Type 2 diabetes mellitus without complications: Secondary | ICD-10-CM | POA: Diagnosis not present

## 2018-10-30 DIAGNOSIS — M159 Polyosteoarthritis, unspecified: Secondary | ICD-10-CM | POA: Diagnosis not present

## 2018-11-21 DIAGNOSIS — Z299 Encounter for prophylactic measures, unspecified: Secondary | ICD-10-CM | POA: Diagnosis not present

## 2018-11-21 DIAGNOSIS — Z6831 Body mass index (BMI) 31.0-31.9, adult: Secondary | ICD-10-CM | POA: Diagnosis not present

## 2018-11-21 DIAGNOSIS — E1165 Type 2 diabetes mellitus with hyperglycemia: Secondary | ICD-10-CM | POA: Diagnosis not present

## 2018-11-21 DIAGNOSIS — J069 Acute upper respiratory infection, unspecified: Secondary | ICD-10-CM | POA: Diagnosis not present

## 2018-11-21 DIAGNOSIS — I10 Essential (primary) hypertension: Secondary | ICD-10-CM | POA: Diagnosis not present

## 2018-12-05 DIAGNOSIS — E7800 Pure hypercholesterolemia, unspecified: Secondary | ICD-10-CM | POA: Diagnosis not present

## 2018-12-05 DIAGNOSIS — R5383 Other fatigue: Secondary | ICD-10-CM | POA: Diagnosis not present

## 2018-12-05 DIAGNOSIS — M329 Systemic lupus erythematosus, unspecified: Secondary | ICD-10-CM | POA: Diagnosis not present

## 2018-12-05 DIAGNOSIS — E1165 Type 2 diabetes mellitus with hyperglycemia: Secondary | ICD-10-CM | POA: Diagnosis not present

## 2018-12-05 DIAGNOSIS — Z299 Encounter for prophylactic measures, unspecified: Secondary | ICD-10-CM | POA: Diagnosis not present

## 2018-12-05 DIAGNOSIS — F419 Anxiety disorder, unspecified: Secondary | ICD-10-CM | POA: Diagnosis not present

## 2018-12-05 DIAGNOSIS — Z79899 Other long term (current) drug therapy: Secondary | ICD-10-CM | POA: Diagnosis not present

## 2018-12-05 DIAGNOSIS — Z125 Encounter for screening for malignant neoplasm of prostate: Secondary | ICD-10-CM | POA: Diagnosis not present

## 2018-12-05 DIAGNOSIS — I10 Essential (primary) hypertension: Secondary | ICD-10-CM | POA: Diagnosis not present

## 2018-12-27 ENCOUNTER — Telehealth: Payer: Self-pay

## 2018-12-27 NOTE — Telephone Encounter (Signed)
Labs results received. Patient is aware.   12/05/2018 CBC, TSH EOS (absolute) 0.5, all other labs WNL.    Reviewed by Hazel Sams, PA-C. Will send document to scan center.

## 2018-12-31 DIAGNOSIS — E119 Type 2 diabetes mellitus without complications: Secondary | ICD-10-CM | POA: Diagnosis not present

## 2018-12-31 DIAGNOSIS — M159 Polyosteoarthritis, unspecified: Secondary | ICD-10-CM | POA: Diagnosis not present

## 2018-12-31 DIAGNOSIS — I10 Essential (primary) hypertension: Secondary | ICD-10-CM | POA: Diagnosis not present

## 2019-02-16 ENCOUNTER — Other Ambulatory Visit: Payer: Self-pay | Admitting: Physician Assistant

## 2019-02-16 DIAGNOSIS — M359 Systemic involvement of connective tissue, unspecified: Secondary | ICD-10-CM

## 2019-02-18 NOTE — Telephone Encounter (Signed)
Last Visit: 10/11/18 Next Visit: 03/14/19 Labs: 10/11/18 CBC and CMP WNL PLQ Eye Exam 08/01/2018 WNL   Okay to refill per Dr. Estanislado Pandy

## 2019-03-06 DIAGNOSIS — I10 Essential (primary) hypertension: Secondary | ICD-10-CM | POA: Diagnosis not present

## 2019-03-06 DIAGNOSIS — G8929 Other chronic pain: Secondary | ICD-10-CM | POA: Diagnosis not present

## 2019-03-06 DIAGNOSIS — Z299 Encounter for prophylactic measures, unspecified: Secondary | ICD-10-CM | POA: Diagnosis not present

## 2019-03-06 DIAGNOSIS — Z6833 Body mass index (BMI) 33.0-33.9, adult: Secondary | ICD-10-CM | POA: Diagnosis not present

## 2019-03-06 DIAGNOSIS — F419 Anxiety disorder, unspecified: Secondary | ICD-10-CM | POA: Diagnosis not present

## 2019-03-06 DIAGNOSIS — M329 Systemic lupus erythematosus, unspecified: Secondary | ICD-10-CM | POA: Diagnosis not present

## 2019-03-07 DIAGNOSIS — M159 Polyosteoarthritis, unspecified: Secondary | ICD-10-CM | POA: Diagnosis not present

## 2019-03-07 DIAGNOSIS — E119 Type 2 diabetes mellitus without complications: Secondary | ICD-10-CM | POA: Diagnosis not present

## 2019-03-07 DIAGNOSIS — I10 Essential (primary) hypertension: Secondary | ICD-10-CM | POA: Diagnosis not present

## 2019-03-08 NOTE — Progress Notes (Deleted)
Office Visit Note  Patient: Daryl Bell             Date of Birth: 03-31-1950           MRN: [id number]             PCP: Monico Blitz, MD Referring: Monico Blitz, MD Visit Date: 03/14/2019 Occupation: @GUAROCC @  Subjective:  No chief complaint on file.   History of Present Illness: Daryl Bell is a 69 y.o. male ***   Activities of Daily Living:  Patient reports morning stiffness for *** {minute/hour:19697}.   Patient {ACTIONS;DENIES/REPORTS:21021675::"Denies"} nocturnal pain.  Difficulty dressing/grooming: {ACTIONS;DENIES/REPORTS:21021675::"Denies"} Difficulty climbing stairs: {ACTIONS;DENIES/REPORTS:21021675::"Denies"} Difficulty getting out of chair: {ACTIONS;DENIES/REPORTS:21021675::"Denies"} Difficulty using hands for taps, buttons, cutlery, and/or writing: {ACTIONS;DENIES/REPORTS:21021675::"Denies"}  No Rheumatology ROS completed.   PMFS History:  Patient Active Problem List   Diagnosis Date Noted  . Neck pain 03/31/2016  . Acute pain of left shoulder 03/31/2016  . ANA positive 12/29/2015  . 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 Photosensitivity 12/29/2015  . High risk medication use 12/29/2015  . Other dysphagia 02/13/2013  . Encounter for screening colonoscopy 02/13/2013    Past Medical History:  Diagnosis Date  . Arthritis   . Diabetes ([place])   . GERD (gastroesophageal reflux disease)   . Hypercholesterolemia   . Hypertension   . Kidney calculus     Family History  Problem Relation Age of Onset  . Dementia Father   . Colon cancer Neg Hx    Past Surgical History:  Procedure Laterality Date  . COLONOSCOPY, ESOPHAGOGASTRODUODENOSCOPY (EGD) AND ESOPHAGEAL DILATION N/A 03/07/2013   Procedure: COLONOSCOPY, ESOPHAGOGASTRODUODENOSCOPY (EGD) AND ESOPHAGEAL DILATION;  Surgeon: Daneil Dolin, MD;  [facility];  Service: Endoscopy;  Laterality: N/A;  11:15  . ESOPHAGOGASTRODUODENOSCOPY     remote past in the 1990s with dilation  .  KIDNEY STONE SURGERY  2011   Social History   Social History Narrative  . Not on file   Immunization History  Administered Date(s) Administered  . Zoster Recombinat (Shingrix) 06/25/2017     Objective: Vital Signs: There were no vitals taken for this visit.   Physical Exam   Musculoskeletal Exam: ***  CDAI Exam: CDAI Score: -- Patient Global: --; Provider Global: -- Swollen: --; Tender: -- Joint Exam 03/14/2019   No joint exam has been documented for this visit   There is currently no information documented on the homunculus. Go to the Rheumatology activity and complete the homunculus joint exam.  Investigation: No additional findings.  Imaging: No results found.  Recent Labs: Lab Results  Component Value Date   WBC 9.3 10/11/2018   HGB 15.0 10/11/2018   PLT 259 10/11/2018   NA 144 10/11/2018   K 5.1 10/11/2018   CL 101 10/11/2018   CO2 31 10/11/2018   GLUCOSE 88 10/11/2018   BUN 13 10/11/2018   CREATININE 0.91 10/11/2018   BILITOT 0.4 10/11/2018   ALKPHOS 46 09/01/2016   AST 19 10/11/2018   ALT 18 10/11/2018   PROT 6.4 10/11/2018   ALBUMIN 4.2 09/01/2016   CALCIUM 9.8 10/11/2018   GFRAA 101 10/11/2018    Speciality Comments: PLQ Eye Exam 08/01/2018 WNL @ [facility] Follow up in 6 months  Procedures:  No procedures performed Allergies: Patient has no known allergies.   Assessment / Plan:     Visit Diagnoses: No diagnosis found.  Orders: No orders of the defined types were placed in this encounter.  No orders  of the defined types were placed in this encounter.   Face-to-face time spent with patient was *** minutes. Greater than 50% of time was spent in counseling and coordination of care.  Follow-Up Instructions: No follow-ups on file.   Ofilia Neas, PA-C  Note - This record has been created using Dragon software.  Chart creation errors have been sought, but may not always  have been located. Such creation errors do not reflect on   the standard of medical care.

## 2019-03-14 ENCOUNTER — Ambulatory Visit: Payer: Medicare Other | Admitting: Rheumatology

## 2019-03-21 DIAGNOSIS — Z23 Encounter for immunization: Secondary | ICD-10-CM | POA: Diagnosis not present

## 2019-04-01 DIAGNOSIS — M159 Polyosteoarthritis, unspecified: Secondary | ICD-10-CM | POA: Diagnosis not present

## 2019-04-01 DIAGNOSIS — E119 Type 2 diabetes mellitus without complications: Secondary | ICD-10-CM | POA: Diagnosis not present

## 2019-04-01 DIAGNOSIS — I10 Essential (primary) hypertension: Secondary | ICD-10-CM | POA: Diagnosis not present

## 2019-04-09 ENCOUNTER — Telehealth: Payer: Self-pay | Admitting: Rheumatology

## 2019-04-09 NOTE — Telephone Encounter (Signed)
Patient advised the Covid vaccine should not interfere with his appointment the following day.

## 2019-04-09 NOTE — Telephone Encounter (Signed)
Patient left a voicemail stating he is having his 2nd COVID vaccine on Wednesday, 3/24 at 3:00 and a follow-up appointment the next day 3/25 with Lovena Le at 10:30.  Patient is requesting a return call to confirm that the vaccine won't interfere with anything that he needs to do at his appointment.

## 2019-04-10 NOTE — Progress Notes (Deleted)
Office Visit Note  Patient: Daryl Bell             Date of Birth: 1950-06-01           MRN: [id number]             PCP: Monico Blitz, MD Referring: Monico Blitz, MD Visit Date: 04/18/2019 Occupation: @GUAROCC @  Subjective:  No chief complaint on file.   History of Present Illness: Daryl Bell is a 69 y.o. male ***   Activities of Daily Living:  Patient reports morning stiffness for *** {minute/hour:19697}.   Patient {ACTIONS;DENIES/REPORTS:21021675::"Denies"} nocturnal pain.  Difficulty dressing/grooming: {ACTIONS;DENIES/REPORTS:21021675::"Denies"} Difficulty climbing stairs: {ACTIONS;DENIES/REPORTS:21021675::"Denies"} Difficulty getting out of chair: {ACTIONS;DENIES/REPORTS:21021675::"Denies"} Difficulty using hands for taps, buttons, cutlery, and/or writing: {ACTIONS;DENIES/REPORTS:21021675::"Denies"}  No Rheumatology ROS completed.   PMFS History:  Patient Active Problem List   Diagnosis Date Noted  . Neck pain 03/31/2016  . Acute pain of left shoulder 03/31/2016  . ANA positive 12/29/2015  . 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 Photosensitivity 12/29/2015  . High risk medication use 12/29/2015  . Other dysphagia 02/13/2013  . Encounter for screening colonoscopy 02/13/2013    Past Medical History:  Diagnosis Date  . Arthritis   . Diabetes ([place])   . GERD (gastroesophageal reflux disease)   . Hypercholesterolemia   . Hypertension   . Kidney calculus     Family History  Problem Relation Age of Onset  . Dementia Father   . Colon cancer Neg Hx    Past Surgical History:  Procedure Laterality Date  . COLONOSCOPY, ESOPHAGOGASTRODUODENOSCOPY (EGD) AND ESOPHAGEAL DILATION N/A 03/07/2013   Procedure: COLONOSCOPY, ESOPHAGOGASTRODUODENOSCOPY (EGD) AND ESOPHAGEAL DILATION;  Surgeon: Daneil Dolin, MD;  Location[facility];  Service: Endoscopy;  Laterality: N/A;  11:15  . ESOPHAGOGASTRODUODENOSCOPY     remote past in the 1990s with dilation  .  KIDNEY STONE SURGERY  2011   Social History   Social History Narrative  . Not on file   Immunization History  Administered Date(s) Administered  . Zoster Recombinat (Shingrix) 06/25/2017     Objective: Vital Signs: There were no vitals taken for this visit.   Physical Exam   Musculoskeletal Exam: ***  CDAI Exam: CDAI Score: -- Patient Global: --; Provider Global: -- Swollen: --; Tender: -- Joint Exam 04/18/2019   No joint exam has been documented for this visit   There is currently no information documented on the homunculus. Go to the Rheumatology activity and complete the homunculus joint exam.  Investigation: No additional findings.  Imaging: No results found.  Recent Labs: Lab Results  Component Value Date   WBC 9.3 10/11/2018   HGB 15.0 10/11/2018   PLT 259 10/11/2018   NA 144 10/11/2018   K 5.1 10/11/2018   CL 101 10/11/2018   CO2 31 10/11/2018   GLUCOSE 88 10/11/2018   BUN 13 10/11/2018   CREATININE 0.91 10/11/2018   BILITOT 0.4 10/11/2018   ALKPHOS 46 09/01/2016   AST 19 10/11/2018   ALT 18 10/11/2018   PROT 6.4 10/11/2018   ALBUMIN 4.2 09/01/2016   CALCIUM 9.8 10/11/2018   GFRAA 101 10/11/2018    Speciality Comments: PLQ Eye Exam 08/01/2018 WNL @ [facility] Follow up in 6 months  Procedures:  No procedures performed Allergies: Patient has no known allergies.   Assessment / Plan:     Visit Diagnoses: No diagnosis found.  Orders: No orders of the defined types were placed in this encounter.  No orders  of the defined types were placed in this encounter.   Face-to-face time spent with patient was *** minutes. Greater than 50% of time was spent in counseling and coordination of care.  Follow-Up Instructions: No follow-ups on file.   Earnestine Mealing, CMA  Note - This record has been created using Editor, commissioning.  Chart creation errors have been sought, but may not always  have been located. Such creation errors do not reflect  on  the standard of medical care.

## 2019-04-18 ENCOUNTER — Ambulatory Visit: Payer: Medicare Other | Admitting: Physician Assistant

## 2019-04-18 DIAGNOSIS — Z23 Encounter for immunization: Secondary | ICD-10-CM | POA: Diagnosis not present

## 2019-04-25 NOTE — Progress Notes (Signed)
Office Visit Note  Patient: Daryl Bell             Date of Birth: [birth year]           MRN: [id number]             PCP: Monico Blitz, MD Referring: Monico Blitz, MD Visit Date: 05/02/2019 Occupation: @GUAROCC @  Subjective:  Joint stiffness   History of Present Illness: Daryl Bell is a 69 y.o. male with history of autoimmune disease.  He has been doing well on Plaquenil without any concerns.  He had no joint swelling today.  He states he gets a stiff in the morning to some extent.  He also continues to have some discomfort in bilateral trochanteric bursa.  He states he gets bruising easily when he bumps into objects.  He takes ibuprofen on as needed basis.  Activities of Daily Living:  Patient reports morning stiffness for 30 minutes.   Patient Denies nocturnal pain.  Difficulty dressing/grooming: Denies Difficulty climbing stairs: Denies Difficulty getting out of chair: Denies Difficulty using hands for taps, buttons, cutlery, and/or writing: Denies  Review of Systems  Constitutional: Negative for fatigue and night sweats.  HENT: Negative for mouth sores, mouth dryness and nose dryness.   Eyes: Positive for dryness. Negative for redness.  Respiratory: Negative for shortness of breath and difficulty breathing.   Cardiovascular: Negative for chest pain, palpitations, hypertension, irregular heartbeat and swelling in legs/feet.  Gastrointestinal: Negative for constipation and diarrhea.  Endocrine: Negative for increased urination.  Genitourinary: Negative for difficulty urinating.  Musculoskeletal: Positive for arthralgias, gait problem, joint pain, morning stiffness and muscle tenderness. Negative for joint swelling, myalgias, muscle weakness and myalgias.  Skin: Positive for rash. Negative for color change, hair loss, nodules/bumps, skin tightness, ulcers and sensitivity to sunlight.  Allergic/Immunologic: Negative for susceptible to infections.  Neurological:  Negative for dizziness, fainting, numbness, memory loss, night sweats and weakness ( ).  Hematological: Positive for bruising/bleeding tendency. Negative for swollen glands.  Psychiatric/Behavioral: Negative for depressed mood and sleep disturbance. The patient is not nervous/anxious.     PMFS History:  Patient Active Problem List   Diagnosis Date Noted  . Neck pain 03/31/2016  . Acute pain of left shoulder 03/31/2016  . ANA positive 12/29/2015  . 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 Photosensitivity 12/29/2015  . High risk medication use 12/29/2015  . Other dysphagia 02/13/2013  . Encounter for screening colonoscopy 02/13/2013    Past Medical History:  Diagnosis Date  . Arthritis   . Diabetes ([place])   . GERD (gastroesophageal reflux disease)   . Hypercholesterolemia   . Hypertension   . Kidney calculus     Family History  Problem Relation Age of Onset  . Dementia Father   . Colon cancer Neg Hx    Past Surgical History:  Procedure Laterality Date  . COLONOSCOPY, ESOPHAGOGASTRODUODENOSCOPY (EGD) AND ESOPHAGEAL DILATION N/A 03/07/2013   Procedure: COLONOSCOPY, ESOPHAGOGASTRODUODENOSCOPY (EGD) AND ESOPHAGEAL DILATION;  Surgeon: Daneil Dolin, MD;  Location: [facility];  Service: Endoscopy;  Laterality: N/A;  11:15  . ESOPHAGOGASTRODUODENOSCOPY     remote past in the 1990s with dilation  . KIDNEY STONE SURGERY  2011   Social History   Social History Narrative  . Not on file   Immunization History  Administered Date(s) Administered  . Influenza, High Dose Seasonal PF 10/25/2017, 11/15/2018  . Moderna SARS-COVID-2 Vaccination 03/21/2019, 04/18/2019  . Pneumococcal Polysaccharide-23 11/15/2018  . Zoster Recombinat (Shingrix) 06/25/2017, 10/25/2017  Objective: Vital Signs: BP 111/71 (BP Location: Left Arm, Patient Position: Sitting, Cuff Size: Normal)   Pulse 88   Resp 18   Ht 5\' 7"  (1.702 m)   Wt 222 lb 3.2 oz (100.8 kg)   BMI 34.80 kg/m    Physical  Exam Vitals and nursing note reviewed.  Constitutional:      Appearance: He is well-developed.  HENT:     Head: Normocephalic and atraumatic.  Eyes:     Conjunctiva/sclera: Conjunctivae normal.     Pupils: Pupils are equal, round, and reactive to light.  Cardiovascular:     Rate and Rhythm: Normal rate and regular rhythm.     Heart sounds: Normal heart sounds.  Pulmonary:     Effort: Pulmonary effort is normal.     Breath sounds: Normal breath sounds.  Abdominal:     General: Bowel sounds are normal.     Palpations: Abdomen is soft.  Musculoskeletal:     Cervical back: Normal range of motion and neck supple.  Skin:    General: Skin is warm and dry.     Capillary Refill: Capillary refill takes less than 2 seconds.  Neurological:     Mental Status: He is alert and oriented to person, place, and time.  Psychiatric:        Behavior: Behavior normal.      Musculoskeletal Exam: C-spine was in good range of motion.  Shoulder joints and elbow joints in good range of motion.  He has bilateral CMC PIP and DIP thickening with no synovitis.  He has good range of motion of his hip joints.  He had tenderness over bilateral trochanteric bursa.  Knee joints ankles and MTPs with good range of motion with no tenderness.  CDAI Exam: CDAI Score: -- Patient Global: --; Provider Global: -- Swollen: --; Tender: -- Joint Exam 05/02/2019   No joint exam has been documented for this visit   There is currently no information documented on the homunculus. Go to the Rheumatology activity and complete the homunculus joint exam.  Investigation: No additional findings.  Imaging: No results found.  Recent Labs: Lab Results  Component Value Date   WBC 9.3 10/11/2018   HGB 15.0 10/11/2018   PLT 259 10/11/2018   NA 144 10/11/2018   K 5.1 10/11/2018   CL 101 10/11/2018   CO2 31 10/11/2018   GLUCOSE 88 10/11/2018   BUN 13 10/11/2018   CREATININE 0.91 10/11/2018   BILITOT 0.4 10/11/2018    ALKPHOS 46 09/01/2016   AST 19 10/11/2018   ALT 18 10/11/2018   PROT 6.4 10/11/2018   ALBUMIN 4.2 09/01/2016   CALCIUM 9.8 10/11/2018   GFRAA 101 10/11/2018    Speciality Comments: PLQ Eye Exam 08/01/2018 WNL @ [facility] Follow up in 6 months  Procedures:  No procedures performed Allergies: Patient has no known allergies.   Assessment / Plan:     Visit Diagnoses: Autoimmune disease ([place]) - Positive ANA, positive double-stranded DNA, history of photosensitivity -he is doing very well on Plaquenil.  He denies any increased photosensitivity or rash recently.  He states he does not use sunscreen on a regular basis but tries to wear clothing which covers his body.  Plan: Urinalysis, Routine w reflex microscopic, ANA, Anti-DNA antibody, double-stranded, C3 and C4, Sedimentation rate  High risk medication use - Plaquenil 200 mg 1 tablet by mouth twice daily. Eye Exam 08/01/2018 - Plan: CBC with Differential/Platelet, COMPLETE METABOLIC PANEL WITH GFR today and then every  5 months.  His eye examination is due in July.  Primary osteoarthritis of both hands-joint protection muscle strengthening was discussed.  Arthritis of carpometacarpal (CMC) joint of left thumb-he has been using a CMC brace which has been helpful.  Trochanteric bursitis of both hips -he takes ibuprofen on as needed basis for discomfort.  I would discourage the use of ibuprofen.  A handout on stretching exercises was given.  Chronic left shoulder pain - left shoulder joint cortisone injection on 08/17/2017.  He is planning on following up with Dr. Onnie Graham or Dr. Veverly Fells at [facility].  Other medical problems are listed as follows:  History of diabetes mellitus  Renal calcinosis  History of hypertension  Vitiligo  History of gastroesophageal reflux (GERD)  History of hypercholesterolemia  Orders: Orders Placed This Encounter  Procedures  . CBC with Differential/Platelet  . COMPLETE METABOLIC PANEL WITH GFR   . Urinalysis, Routine w reflex microscopic  . ANA  . Anti-DNA antibody, double-stranded  . C3 and C4  . Sedimentation rate   No orders of the defined types were placed in this encounter.    Follow-Up Instructions: Return in about 5 months (around 10/02/2019) for Autoimmune disease, Osteoarthritis.   Bo Merino, MD  Note - This record has been created using Editor, commissioning.  Chart creation errors have been sought, but may not always  have been located. Such creation errors do not reflect on  the standard of medical care.

## 2019-05-01 DIAGNOSIS — Z789 Other specified health status: Secondary | ICD-10-CM | POA: Diagnosis not present

## 2019-05-01 DIAGNOSIS — Z299 Encounter for prophylactic measures, unspecified: Secondary | ICD-10-CM | POA: Diagnosis not present

## 2019-05-01 DIAGNOSIS — E1129 Type 2 diabetes mellitus with other diabetic kidney complication: Secondary | ICD-10-CM | POA: Diagnosis not present

## 2019-05-01 DIAGNOSIS — Z6833 Body mass index (BMI) 33.0-33.9, adult: Secondary | ICD-10-CM | POA: Diagnosis not present

## 2019-05-01 DIAGNOSIS — R809 Proteinuria, unspecified: Secondary | ICD-10-CM | POA: Diagnosis not present

## 2019-05-01 DIAGNOSIS — E1165 Type 2 diabetes mellitus with hyperglycemia: Secondary | ICD-10-CM | POA: Diagnosis not present

## 2019-05-01 DIAGNOSIS — E7800 Pure hypercholesterolemia, unspecified: Secondary | ICD-10-CM | POA: Diagnosis not present

## 2019-05-01 DIAGNOSIS — I10 Essential (primary) hypertension: Secondary | ICD-10-CM | POA: Diagnosis not present

## 2019-05-02 ENCOUNTER — Ambulatory Visit (INDEPENDENT_AMBULATORY_CARE_PROVIDER_SITE_OTHER): Payer: Medicare Other | Admitting: Rheumatology

## 2019-05-02 ENCOUNTER — Other Ambulatory Visit: Payer: Self-pay

## 2019-05-02 ENCOUNTER — Encounter: Payer: Self-pay | Admitting: Physician Assistant

## 2019-05-02 VITALS — BP 111/71 | HR 88 | Resp 18 | Ht 67.0 in | Wt 222.2 lb

## 2019-05-02 DIAGNOSIS — M1812 Unilateral primary osteoarthritis of first carpometacarpal joint, left hand: Secondary | ICD-10-CM

## 2019-05-02 DIAGNOSIS — M19042 Primary osteoarthritis, left hand: Secondary | ICD-10-CM | POA: Diagnosis not present

## 2019-05-02 DIAGNOSIS — G8929 Other chronic pain: Secondary | ICD-10-CM

## 2019-05-02 DIAGNOSIS — Z8719 Personal history of other diseases of the digestive system: Secondary | ICD-10-CM

## 2019-05-02 DIAGNOSIS — L80 Vitiligo: Secondary | ICD-10-CM

## 2019-05-02 DIAGNOSIS — M7062 Trochanteric bursitis, left hip: Secondary | ICD-10-CM

## 2019-05-02 DIAGNOSIS — M7061 Trochanteric bursitis, right hip: Secondary | ICD-10-CM

## 2019-05-02 DIAGNOSIS — Z8679 Personal history of other diseases of the circulatory system: Secondary | ICD-10-CM

## 2019-05-02 DIAGNOSIS — M19041 Primary osteoarthritis, right hand: Secondary | ICD-10-CM

## 2019-05-02 DIAGNOSIS — M359 Systemic involvement of connective tissue, unspecified: Secondary | ICD-10-CM

## 2019-05-02 DIAGNOSIS — Z79899 Other long term (current) drug therapy: Secondary | ICD-10-CM

## 2019-05-02 DIAGNOSIS — N29 Other disorders of kidney and ureter in diseases classified elsewhere: Secondary | ICD-10-CM

## 2019-05-02 DIAGNOSIS — M25512 Pain in left shoulder: Secondary | ICD-10-CM

## 2019-05-02 DIAGNOSIS — Z8639 Personal history of other endocrine, nutritional and metabolic disease: Secondary | ICD-10-CM

## 2019-05-02 NOTE — Patient Instructions (Signed)
Iliotibial Band Syndrome Rehab Ask your health care provider which exercises are safe for you. Do exercises exactly as told by your health care provider and adjust them as directed. It is normal to feel mild stretching, pulling, tightness, or discomfort as you do these exercises. Stop right away if you feel sudden pain or your pain gets significantly worse. Do not begin these exercises until told by your health care provider. Stretching and range-of-motion exercises These exercises warm up your muscles and joints and improve the movement and flexibility of your hip and pelvis. Quadriceps stretch, prone  1. Lie on your abdomen on a firm surface, such as a bed or padded floor (prone position). 2. Bend your left / right knee and reach back to hold your ankle or pant leg. If you cannot reach your ankle or pant leg, loop a belt around your foot and grab the belt instead. 3. Gently pull your heel toward your buttocks. Your knee should not slide out to the side. You should feel a stretch in the front of your thigh and knee (quadriceps). 4. Hold this position for __________ seconds. Repeat __________ times. Complete this exercise __________ times a day. Iliotibial band stretch An iliotibial band is a strong band of muscle tissue that runs from the outer side of your hip to the outer side of your thigh and knee. 1. Lie on your side with your left / right leg in the top position. 2. Bend both of your knees and grab your left / right ankle. Stretch out your bottom arm to help you balance. 3. Slowly bring your top knee back so your thigh goes behind your trunk. 4. Slowly lower your top leg toward the floor until you feel a gentle stretch on the outside of your left / right hip and thigh. If you do not feel a stretch and your knee will not fall farther, place the heel of your other foot on top of your knee and pull your knee down toward the floor with your foot. 5. Hold this position for __________  seconds. Repeat __________ times. Complete this exercise __________ times a day. Strengthening exercises These exercises build strength and endurance in your hip and pelvis. Endurance is the ability to use your muscles for a long time, even after they get tired. Straight leg raises, side-lying This exercise strengthens the muscles that rotate the leg at the hip and move it away from your body (hip abductors). 1. Lie on your side with your left / right leg in the top position. Lie so your head, shoulder, hip, and knee line up. You may bend your bottom knee to help you balance. 2. Roll your hips slightly forward so your hips are stacked directly over each other and your left / right knee is facing forward. 3. Tense the muscles in your outer thigh and lift your top leg 4-6 inches (10-15 cm). 4. Hold this position for __________ seconds. 5. Slowly return to the starting position. Let your muscles relax completely before doing another repetition. Repeat __________ times. Complete this exercise __________ times a day. Leg raises, prone This exercise strengthens the muscles that move the hips (hip extensors). 1. Lie on your abdomen on your bed or a firm surface. You can put a pillow under your hips if that is more comfortable for your lower back. 2. Bend your left / right knee so your foot is straight up in the air. 3. Squeeze your buttocks muscles and lift your left / right thigh   off the bed. Do not let your back arch. 4. Tense your thigh muscle as hard as you can without increasing any knee pain. 5. Hold this position for __________ seconds. 6. Slowly lower your leg to the starting position and allow it to relax completely. Repeat __________ times. Complete this exercise __________ times a day. Hip hike 1. Stand sideways on a bottom step. Stand on your left / right leg with your other foot unsupported next to the step. You can hold on to the railing or wall for balance if needed. 2. Keep your knees  straight and your torso square. Then lift your left / right hip up toward the ceiling. 3. Slowly let your left / right hip lower toward the floor, past the starting position. Your foot should get closer to the floor. Do not lean or bend your knees. Repeat __________ times. Complete this exercise __________ times a day. This information is not intended to replace advice given to you by your health care provider. Make sure you discuss any questions you have with your health care provider. Document Revised: 05/03/2018 Document Reviewed: 11/01/2017 Elsevier Patient Education  2020 [employer]  

## 2019-05-03 LAB — CBC WITH DIFFERENTIAL/PLATELET
Absolute Monocytes: 843 cells/uL (ref 200–950)
Basophils Absolute: 167 cells/uL (ref 0–200)
Basophils Relative: 1.7 %
Eosinophils Absolute: 372 cells/uL (ref 15–500)
Eosinophils Relative: 3.8 %
HCT: 42.1 % (ref 38.5–50.0)
Hemoglobin: 14.4 g/dL (ref 13.2–17.1)
Lymphs Abs: 3009 cells/uL (ref 850–3900)
MCH: 31.2 pg (ref 27.0–33.0)
MCHC: 34.2 g/dL (ref 32.0–36.0)
MCV: 91.3 fL (ref 80.0–100.0)
MPV: 11 fL (ref 7.5–12.5)
Monocytes Relative: 8.6 %
Neutro Abs: 5410 cells/uL (ref 1500–7800)
Neutrophils Relative %: 55.2 %
Platelets: 275 10*3/uL (ref 140–400)
RBC: 4.61 10*6/uL (ref 4.20–5.80)
RDW: 12 % (ref 11.0–15.0)
Total Lymphocyte: 30.7 %
WBC: 9.8 10*3/uL (ref 3.8–10.8)

## 2019-05-03 LAB — C3 AND C4
C3 Complement: 118 mg/dL (ref 82–185)
C4 Complement: 22 mg/dL (ref 15–53)

## 2019-05-03 LAB — COMPLETE METABOLIC PANEL WITH GFR
AG Ratio: 2.1 (calc) (ref 1.0–2.5)
ALT: 24 U/L (ref 9–46)
AST: 21 U/L (ref 10–35)
Albumin: 4.1 g/dL (ref 3.6–5.1)
Alkaline phosphatase (APISO): 47 U/L (ref 35–144)
BUN: 9 mg/dL (ref 7–25)
CO2: 33 mmol/L — ABNORMAL HIGH (ref 20–32)
Calcium: 9.3 mg/dL (ref 8.6–10.3)
Chloride: 101 mmol/L (ref 98–110)
Creat: 0.9 mg/dL (ref 0.70–1.25)
GFR, Est African American: 103 mL/min/{1.73_m2} (ref >=60)
GFR, Est Non African American: 89 mL/min/{1.73_m2} (ref >=60)
Globulin: 2 g/dL (calc) (ref 1.9–3.7)
Glucose, Bld: 91 mg/dL (ref 65–99)
Potassium: 4.4 mmol/L (ref 3.5–5.3)
Sodium: 140 mmol/L (ref 135–146)
Total Bilirubin: 0.3 mg/dL (ref 0.2–1.2)
Total Protein: 6.1 g/dL (ref 6.1–8.1)

## 2019-05-03 LAB — URINALYSIS, ROUTINE W REFLEX MICROSCOPIC
Bilirubin Urine: NEGATIVE
Glucose, UA: NEGATIVE
Hgb urine dipstick: NEGATIVE
Ketones, ur: NEGATIVE
Leukocytes,Ua: NEGATIVE
Nitrite: NEGATIVE
Protein, ur: NEGATIVE
Specific Gravity, Urine: 1.013 (ref 1.001–1.03)
pH: <=5 (ref 5.0–8.0)

## 2019-05-03 LAB — ANA: Anti Nuclear Antibody (ANA): NEGATIVE

## 2019-05-03 LAB — ANTI-DNA ANTIBODY, DOUBLE-STRANDED: ds DNA Ab: 6 IU/mL — ABNORMAL HIGH

## 2019-05-03 LAB — SEDIMENTATION RATE: Sed Rate: 6 mm/h (ref 0–20)

## 2019-05-03 NOTE — Progress Notes (Signed)
Labs are stable and do not indicate flare of autoimmune disease.

## 2019-05-09 DIAGNOSIS — E119 Type 2 diabetes mellitus without complications: Secondary | ICD-10-CM | POA: Diagnosis not present

## 2019-05-09 DIAGNOSIS — I10 Essential (primary) hypertension: Secondary | ICD-10-CM | POA: Diagnosis not present

## 2019-05-09 DIAGNOSIS — M159 Polyosteoarthritis, unspecified: Secondary | ICD-10-CM | POA: Diagnosis not present

## 2019-06-05 DIAGNOSIS — I10 Essential (primary) hypertension: Secondary | ICD-10-CM | POA: Diagnosis not present

## 2019-06-05 DIAGNOSIS — G8911 Acute pain due to trauma: Secondary | ICD-10-CM | POA: Diagnosis not present

## 2019-06-05 DIAGNOSIS — E1165 Type 2 diabetes mellitus with hyperglycemia: Secondary | ICD-10-CM | POA: Diagnosis not present

## 2019-06-05 DIAGNOSIS — J069 Acute upper respiratory infection, unspecified: Secondary | ICD-10-CM | POA: Diagnosis not present

## 2019-06-05 DIAGNOSIS — M160 Bilateral primary osteoarthritis of hip: Secondary | ICD-10-CM | POA: Diagnosis not present

## 2019-06-05 DIAGNOSIS — S79912A Unspecified injury of left hip, initial encounter: Secondary | ICD-10-CM | POA: Diagnosis not present

## 2019-06-05 DIAGNOSIS — M25552 Pain in left hip: Secondary | ICD-10-CM | POA: Diagnosis not present

## 2019-06-05 DIAGNOSIS — Z299 Encounter for prophylactic measures, unspecified: Secondary | ICD-10-CM | POA: Diagnosis not present

## 2019-06-23 DIAGNOSIS — M159 Polyosteoarthritis, unspecified: Secondary | ICD-10-CM | POA: Diagnosis not present

## 2019-06-23 DIAGNOSIS — E119 Type 2 diabetes mellitus without complications: Secondary | ICD-10-CM | POA: Diagnosis not present

## 2019-06-23 DIAGNOSIS — I10 Essential (primary) hypertension: Secondary | ICD-10-CM | POA: Diagnosis not present

## 2019-07-24 DIAGNOSIS — E119 Type 2 diabetes mellitus without complications: Secondary | ICD-10-CM | POA: Diagnosis not present

## 2019-07-24 DIAGNOSIS — I10 Essential (primary) hypertension: Secondary | ICD-10-CM | POA: Diagnosis not present

## 2019-07-24 DIAGNOSIS — M159 Polyosteoarthritis, unspecified: Secondary | ICD-10-CM | POA: Diagnosis not present

## 2019-08-07 DIAGNOSIS — H9319 Tinnitus, unspecified ear: Secondary | ICD-10-CM | POA: Diagnosis not present

## 2019-08-07 DIAGNOSIS — E1142 Type 2 diabetes mellitus with diabetic polyneuropathy: Secondary | ICD-10-CM | POA: Diagnosis not present

## 2019-08-07 DIAGNOSIS — Z6833 Body mass index (BMI) 33.0-33.9, adult: Secondary | ICD-10-CM | POA: Diagnosis not present

## 2019-08-07 DIAGNOSIS — Z299 Encounter for prophylactic measures, unspecified: Secondary | ICD-10-CM | POA: Diagnosis not present

## 2019-08-07 DIAGNOSIS — Z789 Other specified health status: Secondary | ICD-10-CM | POA: Diagnosis not present

## 2019-08-07 DIAGNOSIS — I10 Essential (primary) hypertension: Secondary | ICD-10-CM | POA: Diagnosis not present

## 2019-08-07 DIAGNOSIS — E1165 Type 2 diabetes mellitus with hyperglycemia: Secondary | ICD-10-CM | POA: Diagnosis not present

## 2019-08-23 DIAGNOSIS — M159 Polyosteoarthritis, unspecified: Secondary | ICD-10-CM | POA: Diagnosis not present

## 2019-08-23 DIAGNOSIS — E119 Type 2 diabetes mellitus without complications: Secondary | ICD-10-CM | POA: Diagnosis not present

## 2019-08-23 DIAGNOSIS — I10 Essential (primary) hypertension: Secondary | ICD-10-CM | POA: Diagnosis not present

## 2019-08-27 DIAGNOSIS — I10 Essential (primary) hypertension: Secondary | ICD-10-CM | POA: Diagnosis not present

## 2019-08-27 DIAGNOSIS — Z299 Encounter for prophylactic measures, unspecified: Secondary | ICD-10-CM | POA: Diagnosis not present

## 2019-08-27 DIAGNOSIS — Z789 Other specified health status: Secondary | ICD-10-CM | POA: Diagnosis not present

## 2019-08-27 DIAGNOSIS — M329 Systemic lupus erythematosus, unspecified: Secondary | ICD-10-CM | POA: Diagnosis not present

## 2019-08-27 DIAGNOSIS — J019 Acute sinusitis, unspecified: Secondary | ICD-10-CM | POA: Diagnosis not present

## 2019-08-27 DIAGNOSIS — E1129 Type 2 diabetes mellitus with other diabetic kidney complication: Secondary | ICD-10-CM | POA: Diagnosis not present

## 2019-09-05 DIAGNOSIS — E1165 Type 2 diabetes mellitus with hyperglycemia: Secondary | ICD-10-CM | POA: Diagnosis not present

## 2019-09-05 DIAGNOSIS — M329 Systemic lupus erythematosus, unspecified: Secondary | ICD-10-CM | POA: Diagnosis not present

## 2019-09-05 DIAGNOSIS — Z789 Other specified health status: Secondary | ICD-10-CM | POA: Diagnosis not present

## 2019-09-05 DIAGNOSIS — I10 Essential (primary) hypertension: Secondary | ICD-10-CM | POA: Diagnosis not present

## 2019-09-05 DIAGNOSIS — Z299 Encounter for prophylactic measures, unspecified: Secondary | ICD-10-CM | POA: Diagnosis not present

## 2019-09-10 DIAGNOSIS — M159 Polyosteoarthritis, unspecified: Secondary | ICD-10-CM | POA: Diagnosis not present

## 2019-09-10 DIAGNOSIS — E119 Type 2 diabetes mellitus without complications: Secondary | ICD-10-CM | POA: Diagnosis not present

## 2019-09-10 DIAGNOSIS — I10 Essential (primary) hypertension: Secondary | ICD-10-CM | POA: Diagnosis not present

## 2019-09-19 NOTE — Progress Notes (Deleted)
Office Visit Note  Patient: Daryl Bell             Date of Birth: 03/17/1950           MRN: [id number]             PCP: Monico Blitz, MD Referring: Monico Blitz, MD Visit Date: 10/03/2019 Occupation: @GUAROCC @  Subjective:  No chief complaint on file.   History of Present Illness: Daryl Bell is a 69 y.o. male ***   Activities of Daily Living:  Patient reports morning stiffness for *** {minute/hour:19697}.   Patient {ACTIONS;DENIES/REPORTS:21021675::"Denies"} nocturnal pain.  Difficulty dressing/grooming: {ACTIONS;DENIES/REPORTS:21021675::"Denies"} Difficulty climbing stairs: {ACTIONS;DENIES/REPORTS:21021675::"Denies"} Difficulty getting out of chair: {ACTIONS;DENIES/REPORTS:21021675::"Denies"} Difficulty using hands for taps, buttons, cutlery, and/or writing: {ACTIONS;DENIES/REPORTS:21021675::"Denies"}  No Rheumatology ROS completed.   PMFS History:  Patient Active Problem List   Diagnosis Date Noted  . Neck pain 03/31/2016  . Acute pain of left shoulder 03/31/2016  . ANA positive 12/29/2015  . 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 Photosensitivity 12/29/2015  . High risk medication use 12/29/2015  . Other dysphagia 02/13/2013  . Encounter for screening colonoscopy 02/13/2013    Past Medical History:  Diagnosis Date  . Arthritis   . Diabetes ([place])   . GERD (gastroesophageal reflux disease)   . Hypercholesterolemia   . Hypertension   . Kidney calculus     Family History  Problem Relation Age of Onset  . Dementia Father   . Colon cancer Neg Hx    Past Surgical History:  Procedure Laterality Date  . COLONOSCOPY, ESOPHAGOGASTRODUODENOSCOPY (EGD) AND ESOPHAGEAL DILATION N/A 03/07/2013   Procedure: COLONOSCOPY, ESOPHAGOGASTRODUODENOSCOPY (EGD) AND ESOPHAGEAL DILATION;  Surgeon: Daneil Dolin, MD;  [facility];  Service: Endoscopy;  Laterality: N/A;  11:15  . ESOPHAGOGASTRODUODENOSCOPY     remote past in the 1990s with dilation  .  KIDNEY STONE SURGERY  2011   Social History   Social History Narrative  . Not on file   Immunization History  Administered Date(s) Administered  . Influenza, High Dose Seasonal PF 10/25/2017, 11/15/2018  . Moderna SARS-COVID-2 Vaccination 03/21/2019, 04/18/2019  . Pneumococcal Polysaccharide-23 11/15/2018  . Zoster Recombinat (Shingrix) 06/25/2017, 10/25/2017     Objective: Vital Signs: There were no vitals taken for this visit.   Physical Exam   Musculoskeletal Exam: ***  CDAI Exam: CDAI Score: -- Patient Global: --; Provider Global: -- Swollen: --; Tender: -- Joint Exam 10/03/2019   No joint exam has been documented for this visit   There is currently no information documented on the homunculus. Go to the Rheumatology activity and complete the homunculus joint exam.  Investigation: No additional findings.  Imaging: No results found.  Recent Labs: Lab Results  Component Value Date   WBC 9.8 05/02/2019   HGB 14.4 05/02/2019   PLT 275 05/02/2019   NA 140 05/02/2019   K 4.4 05/02/2019   CL 101 05/02/2019   CO2 33 (H) 05/02/2019   GLUCOSE 91 05/02/2019   BUN 9 05/02/2019   CREATININE 0.90 05/02/2019   BILITOT 0.3 05/02/2019   ALKPHOS 46 09/01/2016   AST 21 05/02/2019   ALT 24 05/02/2019   PROT 6.1 05/02/2019   ALBUMIN 4.2 09/01/2016   CALCIUM 9.3 05/02/2019   GFRAA 103 05/02/2019    Speciality Comments: PLQ Eye Exam 08/01/2018 WNL @ [facility] Follow up in 6 months  Procedures:  No procedures performed Allergies: Patient has no known allergies.   Assessment / Plan:  Visit Diagnoses: No diagnosis found.  Orders: No orders of the defined types were placed in this encounter.  No orders of the defined types were placed in this encounter.   Face-to-face time spent with patient was *** minutes. Greater than 50% of time was spent in counseling and coordination of care.  Follow-Up Instructions: No follow-ups on file.   Earnestine Mealing,  CMA  Note - This record has been created using Editor, commissioning.  Chart creation errors have been sought, but may not always  have been located. Such creation errors do not reflect on  the standard of medical care.

## 2019-10-03 ENCOUNTER — Ambulatory Visit: Payer: Medicare Other | Admitting: Rheumatology

## 2019-10-24 DIAGNOSIS — Z79899 Other long term (current) drug therapy: Secondary | ICD-10-CM | POA: Diagnosis not present

## 2019-10-24 DIAGNOSIS — I10 Essential (primary) hypertension: Secondary | ICD-10-CM | POA: Diagnosis not present

## 2019-10-24 DIAGNOSIS — E7800 Pure hypercholesterolemia, unspecified: Secondary | ICD-10-CM | POA: Diagnosis not present

## 2019-10-24 DIAGNOSIS — Z0000 Encounter for general adult medical examination without abnormal findings: Secondary | ICD-10-CM | POA: Diagnosis not present

## 2019-10-24 DIAGNOSIS — Z1339 Encounter for screening examination for other mental health and behavioral disorders: Secondary | ICD-10-CM | POA: Diagnosis not present

## 2019-10-24 DIAGNOSIS — Z299 Encounter for prophylactic measures, unspecified: Secondary | ICD-10-CM | POA: Diagnosis not present

## 2019-10-24 DIAGNOSIS — Z7189 Other specified counseling: Secondary | ICD-10-CM | POA: Diagnosis not present

## 2019-10-24 DIAGNOSIS — E119 Type 2 diabetes mellitus without complications: Secondary | ICD-10-CM | POA: Diagnosis not present

## 2019-10-24 DIAGNOSIS — R5383 Other fatigue: Secondary | ICD-10-CM | POA: Diagnosis not present

## 2019-10-24 DIAGNOSIS — M159 Polyosteoarthritis, unspecified: Secondary | ICD-10-CM | POA: Diagnosis not present

## 2019-10-24 DIAGNOSIS — Z125 Encounter for screening for malignant neoplasm of prostate: Secondary | ICD-10-CM | POA: Diagnosis not present

## 2019-10-24 DIAGNOSIS — Z6833 Body mass index (BMI) 33.0-33.9, adult: Secondary | ICD-10-CM | POA: Diagnosis not present

## 2019-10-24 DIAGNOSIS — Z1331 Encounter for screening for depression: Secondary | ICD-10-CM | POA: Diagnosis not present

## 2019-10-31 DIAGNOSIS — Z79899 Other long term (current) drug therapy: Secondary | ICD-10-CM | POA: Diagnosis not present

## 2019-11-01 NOTE — Progress Notes (Signed)
Office Visit Note  Patient: Daryl Bell             Date of Birth: 1950/03/13           MRN: [id number]             PCP: Monico Blitz, MD Referring: Monico Blitz, MD Visit Date: 11/14/2019 Occupation: @GUAROCC @  Subjective:  Medication management.   History of Present Illness: Daryl Bell is a 69 y.o. male with history of autoimmune disease.  He states he has been doing well on hydroxychloroquine.  He denies any rash.  He states he is doing much better since he started using CPAP.  He has had decreased fatigue.  He has been more active.  Activities of Daily Living:  Patient reports morning stiffness for 10 minutes.   Patient Denies nocturnal pain.  Difficulty dressing/grooming: Denies Difficulty climbing stairs: Denies Difficulty getting out of chair: Denies Difficulty using hands for taps, buttons, cutlery, and/or writing: Denies  Review of Systems  Constitutional: Negative for fatigue.  HENT: Negative for mouth sores, mouth dryness and nose dryness.   Eyes: Positive for dryness. Negative for pain, itching and visual disturbance.  Respiratory: Negative for cough, hemoptysis, shortness of breath and difficulty breathing.   Cardiovascular: Negative for chest pain, palpitations and swelling in legs/feet.  Gastrointestinal: Negative for abdominal pain, blood in stool, constipation and diarrhea.  Endocrine: Negative for increased urination.  Genitourinary: Negative for painful urination.  Musculoskeletal: Positive for arthralgias, joint pain and morning stiffness. Negative for joint swelling, myalgias, muscle weakness, muscle tenderness and myalgias.  Skin: Negative for color change, rash and redness.  Allergic/Immunologic: Negative for susceptible to infections.  Neurological: Positive for numbness. Negative for dizziness, headaches, memory loss and weakness.  Hematological: Negative for swollen glands.  Psychiatric/Behavioral: Negative for confusion and sleep  disturbance.    PMFS History:  Patient Active Problem List   Diagnosis Date Noted  . Neck pain 03/31/2016  . Acute pain of left shoulder 03/31/2016  . ANA positive 12/29/2015  . 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 Photosensitivity 12/29/2015  . High risk medication use 12/29/2015  . Other dysphagia 02/13/2013  . Encounter for screening colonoscopy 02/13/2013    Past Medical History:  Diagnosis Date  . Arthritis   . Diabetes ([place])   . GERD (gastroesophageal reflux disease)   . Hypercholesterolemia   . Hypertension   . Kidney calculus     Family History  Problem Relation Age of Onset  . Dementia Father   . Colon cancer Neg Hx    Past Surgical History:  Procedure Laterality Date  . COLONOSCOPY, ESOPHAGOGASTRODUODENOSCOPY (EGD) AND ESOPHAGEAL DILATION N/A 03/07/2013   Procedure: COLONOSCOPY, ESOPHAGOGASTRODUODENOSCOPY (EGD) AND ESOPHAGEAL DILATION;  Surgeon: Daneil Dolin, MD;  [facility];  Service: Endoscopy;  Laterality: N/A;  11:15  . ESOPHAGOGASTRODUODENOSCOPY     remote past in the 1990s with dilation  . KIDNEY STONE SURGERY  2011   Social History   Social History Narrative  . Not on file   Immunization History  Administered Date(s) Administered  . Influenza, High Dose Seasonal PF 10/25/2017, 11/15/2018  . Moderna SARS-COVID-2 Vaccination 03/21/2019, 04/18/2019  . Pneumococcal Polysaccharide-23 11/15/2018  . Zoster Recombinat (Shingrix) 06/25/2017, 10/25/2017     Objective: Vital Signs: BP 119/77 (BP Location: Right Arm, Patient Position: Sitting, Cuff Size: Small)   Pulse 68   Resp 14   Ht 5\' 7"  (1.702 m)   Wt 225 lb 12.8 oz (102.4 kg)  BMI 35.37 kg/m    Physical Exam Vitals and nursing note reviewed.  Constitutional:      Appearance: He is well-developed.  HENT:     Head: Normocephalic and atraumatic.  Eyes:     Conjunctiva/sclera: Conjunctivae normal.     Pupils: Pupils are equal, round, and reactive to light.  Cardiovascular:       Rate and Rhythm: Normal rate and regular rhythm.     Heart sounds: Normal heart sounds.  Pulmonary:     Effort: Pulmonary effort is normal.     Breath sounds: Normal breath sounds.  Abdominal:     General: Bowel sounds are normal.     Palpations: Abdomen is soft.  Musculoskeletal:     Cervical back: Normal range of motion and neck supple.  Skin:    General: Skin is warm and dry.     Capillary Refill: Capillary refill takes less than 2 seconds.  Neurological:     Mental Status: He is alert and oriented to person, place, and time.  Psychiatric:        Behavior: Behavior normal.      Musculoskeletal Exam: C-spine was in good range of motion.  Shoulder joints, elbow joints, wrist joints, MCPs PIPs and DIPs with good range of motion with no synovitis.  He has some PIP and DIP thickening.  Hip joints, knee joints with good range of motion.  He had no tenderness over ankle joints or MTPs.  CDAI Exam: CDAI Score: -- Patient Global: --; Provider Global: -- Swollen: --; Tender: -- Joint Exam 11/14/2019   No joint exam has been documented for this visit   There is currently no information documented on the homunculus. Go to the Rheumatology activity and complete the homunculus joint exam.  Investigation: No additional findings.  Imaging: No results found.  Recent Labs: Lab Results  Component Value Date   WBC 9.8 05/02/2019   HGB 14.4 05/02/2019   PLT 275 05/02/2019   NA 140 05/02/2019   K 4.4 05/02/2019   CL 101 05/02/2019   CO2 33 (H) 05/02/2019   GLUCOSE 91 05/02/2019   BUN 9 05/02/2019   CREATININE 0.90 05/02/2019   BILITOT 0.3 05/02/2019   ALKPHOS 46 09/01/2016   AST 21 05/02/2019   ALT 24 05/02/2019   PROT 6.1 05/02/2019   ALBUMIN 4.2 09/01/2016   CALCIUM 9.3 05/02/2019   GFRAA 103 05/02/2019    Speciality Comments: PLQ Eye Exam 10/31/2019 WNL @ [facility] Follow up in 6 months  Procedures:  No procedures performed Allergies: Patient has no known  allergies.   Assessment / Plan:     Visit Diagnoses: Autoimmune disease ([place]) - Positive ANA, positive double-stranded DNA, history of photosensitivity  -he is clinically doing well without any flares.  He denies any recent rash or photosensitivity.  Will obtain labs today.  Plan: hydroxychloroquine (PLAQUENIL) 200 MG tablet, Urinalysis, Routine w reflex microscopic, ANA, Anti-DNA antibody, double-stranded, C3 and C4, Sedimentation rate  High risk medication use - Plaquenil 200 mg 1 tablet by mouth twice daily.  Prescription refill was given today.  Eye Exam October 31, 2019- Plan: CBC with Differential/Platelet, COMPLETE METABOLIC PANEL WITH GFR  Primary osteoarthritis of both hands-joint protection muscle strengthening was discussed.  Arthritis of carpometacarpal (CMC) joint of left thumb  Trochanteric bursitis of both hips-he continues to have some IT band discomfort.  I have advised him to do IT band stretches on a regular basis.  Chronic left shoulder pain-off-and-on discomfort.  Doing better today.  History of diabetes mellitus  Renal calcinosis  History of hypertension  History of gastroesophageal reflux (GERD)  History of hypercholesterolemia  History of sleep apnea-he was recently given CPAP which has improved insomnia and fatigue.  Educated about COVID-19 virus infection-patient has been fully vaccinated against COVID-19.  Have advised him to get a booster.  Use of mask, social distancing and hand hygiene was discussed.  Use of monoclonal antibodies were discussed in case he develops COVID-19 infection.  Orders: Orders Placed This Encounter  Procedures  . CBC with Differential/Platelet  . COMPLETE METABOLIC PANEL WITH GFR  . Urinalysis, Routine w reflex microscopic  . ANA  . Anti-DNA antibody, double-stranded  . C3 and C4  . Sedimentation rate   Meds ordered this encounter  Medications  . hydroxychloroquine (PLAQUENIL) 200 MG tablet    Sig: Take 1 tablet (200 mg  total) by mouth 2 (two) times daily.    Dispense:  180 tablet    Refill:  0     Follow-Up Instructions: No follow-ups on file.   Bo Merino, MD  Note - This record has been created using Editor, commissioning.  Chart creation errors have been sought, but may not always  have been located. Such creation errors do not reflect on  the standard of medical care.

## 2019-11-04 DIAGNOSIS — Z23 Encounter for immunization: Secondary | ICD-10-CM | POA: Diagnosis not present

## 2019-11-14 ENCOUNTER — Other Ambulatory Visit: Payer: Self-pay

## 2019-11-14 ENCOUNTER — Encounter: Payer: Self-pay | Admitting: Rheumatology

## 2019-11-14 ENCOUNTER — Ambulatory Visit (INDEPENDENT_AMBULATORY_CARE_PROVIDER_SITE_OTHER): Payer: Medicare Other | Admitting: Rheumatology

## 2019-11-14 VITALS — BP 119/77 | HR 68 | Resp 14 | Ht 67.0 in | Wt 225.8 lb

## 2019-11-14 DIAGNOSIS — Z8639 Personal history of other endocrine, nutritional and metabolic disease: Secondary | ICD-10-CM | POA: Diagnosis not present

## 2019-11-14 DIAGNOSIS — Z79899 Other long term (current) drug therapy: Secondary | ICD-10-CM

## 2019-11-14 DIAGNOSIS — Z8679 Personal history of other diseases of the circulatory system: Secondary | ICD-10-CM

## 2019-11-14 DIAGNOSIS — M7061 Trochanteric bursitis, right hip: Secondary | ICD-10-CM | POA: Diagnosis not present

## 2019-11-14 DIAGNOSIS — Z8669 Personal history of other diseases of the nervous system and sense organs: Secondary | ICD-10-CM | POA: Diagnosis not present

## 2019-11-14 DIAGNOSIS — M359 Systemic involvement of connective tissue, unspecified: Secondary | ICD-10-CM | POA: Diagnosis not present

## 2019-11-14 DIAGNOSIS — Z8719 Personal history of other diseases of the digestive system: Secondary | ICD-10-CM

## 2019-11-14 DIAGNOSIS — G8929 Other chronic pain: Secondary | ICD-10-CM

## 2019-11-14 DIAGNOSIS — N29 Other disorders of kidney and ureter in diseases classified elsewhere: Secondary | ICD-10-CM

## 2019-11-14 DIAGNOSIS — Z7189 Other specified counseling: Secondary | ICD-10-CM

## 2019-11-14 DIAGNOSIS — M1812 Unilateral primary osteoarthritis of first carpometacarpal joint, left hand: Secondary | ICD-10-CM | POA: Diagnosis not present

## 2019-11-14 DIAGNOSIS — M25512 Pain in left shoulder: Secondary | ICD-10-CM

## 2019-11-14 DIAGNOSIS — M19042 Primary osteoarthritis, left hand: Secondary | ICD-10-CM

## 2019-11-14 DIAGNOSIS — M19041 Primary osteoarthritis, right hand: Secondary | ICD-10-CM

## 2019-11-14 DIAGNOSIS — M7062 Trochanteric bursitis, left hip: Secondary | ICD-10-CM

## 2019-11-14 MED ORDER — HYDROXYCHLOROQUINE SULFATE 200 MG PO TABS: 200.0000 mg | ORAL_TABLET | Freq: Two times a day (BID) | ORAL | 0 refills | Status: DC

## 2019-11-14 NOTE — Patient Instructions (Signed)
COVID-19 vaccine recommendations:   COVID-19 vaccine is recommended for everyone (unless you are allergic to a vaccine component), even if you are on a medication that suppresses your immune system.   There is no direct evidence about the efficacy of the COVID-19 vaccine in individuals who are on medications that suppress the immune system.   Even if you are fully vaccinated, and you are on any medications that suppress your immune system, please continue to wear a mask, maintain at least six feet social distance and practice hand hygiene.   If you develop a COVID-19 infection, please contact your PCP or our office to determine if you need antibody infusion.  The booster vaccine is now available for immunocompromised patients. It is advised that if you had Pfizer vaccine you should get [employer] booster.  If you had a Moderna vaccine then you should get a Moderna booster. Johnson and Wynetta Emery does not have a booster vaccine at this time.  Please see the following web sites for updated information.   [email or web address]

## 2019-11-15 NOTE — Progress Notes (Signed)
All the labs are stable.  Do not indicate active lupus.  No change in treatment advised.

## 2019-11-17 LAB — URINALYSIS, ROUTINE W REFLEX MICROSCOPIC
Bilirubin Urine: NEGATIVE
Glucose, UA: NEGATIVE
Hgb urine dipstick: NEGATIVE
Ketones, ur: NEGATIVE
Leukocytes,Ua: NEGATIVE
Nitrite: NEGATIVE
Protein, ur: NEGATIVE
Specific Gravity, Urine: 1.015 (ref 1.001–1.03)
pH: 5.5 (ref 5.0–8.0)

## 2019-11-17 LAB — CBC WITH DIFFERENTIAL/PLATELET
Absolute Monocytes: 670 cells/uL (ref 200–950)
Basophils Absolute: 100 cells/uL (ref 0–200)
Basophils Relative: 1.3 %
Eosinophils Absolute: 277 cells/uL (ref 15–500)
Eosinophils Relative: 3.6 %
HCT: 44 % (ref 38.5–50.0)
Hemoglobin: 14.7 g/dL (ref 13.2–17.1)
Lymphs Abs: 2418 cells/uL (ref 850–3900)
MCH: 30.6 pg (ref 27.0–33.0)
MCHC: 33.4 g/dL (ref 32.0–36.0)
MCV: 91.7 fL (ref 80.0–100.0)
MPV: 11.5 fL (ref 7.5–12.5)
Monocytes Relative: 8.7 %
Neutro Abs: 4235 cells/uL (ref 1500–7800)
Neutrophils Relative %: 55 %
Platelets: 192 10*3/uL (ref 140–400)
RBC: 4.8 10*6/uL (ref 4.20–5.80)
RDW: 12.2 % (ref 11.0–15.0)
Total Lymphocyte: 31.4 %
WBC: 7.7 10*3/uL (ref 3.8–10.8)

## 2019-11-17 LAB — COMPLETE METABOLIC PANEL WITH GFR
AG Ratio: 1.9 (calc) (ref 1.0–2.5)
ALT: 17 U/L (ref 9–46)
AST: 17 U/L (ref 10–35)
Albumin: 4 g/dL (ref 3.6–5.1)
Alkaline phosphatase (APISO): 48 U/L (ref 35–144)
BUN: 17 mg/dL (ref 7–25)
CO2: 31 mmol/L (ref 20–32)
Calcium: 8.8 mg/dL (ref 8.6–10.3)
Chloride: 101 mmol/L (ref 98–110)
Creat: 0.95 mg/dL (ref 0.70–1.25)
GFR, Est African American: 96 mL/min/{1.73_m2} (ref >=60)
GFR, Est Non African American: 82 mL/min/{1.73_m2} (ref >=60)
Globulin: 2.1 g/dL (calc) (ref 1.9–3.7)
Glucose, Bld: 115 mg/dL — ABNORMAL HIGH (ref 65–99)
Potassium: 3.8 mmol/L (ref 3.5–5.3)
Sodium: 140 mmol/L (ref 135–146)
Total Bilirubin: 0.4 mg/dL (ref 0.2–1.2)
Total Protein: 6.1 g/dL (ref 6.1–8.1)

## 2019-11-17 LAB — SEDIMENTATION RATE: Sed Rate: 2 mm/h (ref 0–20)

## 2019-11-17 LAB — C3 AND C4
C3 Complement: 122 mg/dL (ref 82–185)
C4 Complement: 22 mg/dL (ref 15–53)

## 2019-11-17 LAB — ANA: Anti Nuclear Antibody (ANA): NEGATIVE

## 2019-11-17 LAB — ANTI-DNA ANTIBODY, DOUBLE-STRANDED: ds DNA Ab: 5 IU/mL — ABNORMAL HIGH

## 2019-11-20 DIAGNOSIS — I10 Essential (primary) hypertension: Secondary | ICD-10-CM | POA: Diagnosis not present

## 2019-11-20 DIAGNOSIS — E1165 Type 2 diabetes mellitus with hyperglycemia: Secondary | ICD-10-CM | POA: Diagnosis not present

## 2019-11-20 DIAGNOSIS — Z299 Encounter for prophylactic measures, unspecified: Secondary | ICD-10-CM | POA: Diagnosis not present

## 2019-11-20 DIAGNOSIS — Z6833 Body mass index (BMI) 33.0-33.9, adult: Secondary | ICD-10-CM | POA: Diagnosis not present

## 2019-11-20 DIAGNOSIS — E1142 Type 2 diabetes mellitus with diabetic polyneuropathy: Secondary | ICD-10-CM | POA: Diagnosis not present

## 2019-11-20 DIAGNOSIS — G4730 Sleep apnea, unspecified: Secondary | ICD-10-CM | POA: Diagnosis not present

## 2019-11-22 DIAGNOSIS — M159 Polyosteoarthritis, unspecified: Secondary | ICD-10-CM | POA: Diagnosis not present

## 2019-11-22 DIAGNOSIS — I10 Essential (primary) hypertension: Secondary | ICD-10-CM | POA: Diagnosis not present

## 2019-11-22 DIAGNOSIS — E119 Type 2 diabetes mellitus without complications: Secondary | ICD-10-CM | POA: Diagnosis not present

## 2019-12-05 DIAGNOSIS — M329 Systemic lupus erythematosus, unspecified: Secondary | ICD-10-CM | POA: Diagnosis not present

## 2019-12-05 DIAGNOSIS — E1165 Type 2 diabetes mellitus with hyperglycemia: Secondary | ICD-10-CM | POA: Diagnosis not present

## 2019-12-05 DIAGNOSIS — R809 Proteinuria, unspecified: Secondary | ICD-10-CM | POA: Diagnosis not present

## 2019-12-05 DIAGNOSIS — E1129 Type 2 diabetes mellitus with other diabetic kidney complication: Secondary | ICD-10-CM | POA: Diagnosis not present

## 2019-12-05 DIAGNOSIS — Z299 Encounter for prophylactic measures, unspecified: Secondary | ICD-10-CM | POA: Diagnosis not present

## 2019-12-09 DIAGNOSIS — Z23 Encounter for immunization: Secondary | ICD-10-CM | POA: Diagnosis not present

## 2019-12-11 DIAGNOSIS — I10 Essential (primary) hypertension: Secondary | ICD-10-CM | POA: Diagnosis not present

## 2019-12-11 DIAGNOSIS — R809 Proteinuria, unspecified: Secondary | ICD-10-CM | POA: Diagnosis not present

## 2019-12-11 DIAGNOSIS — J019 Acute sinusitis, unspecified: Secondary | ICD-10-CM | POA: Diagnosis not present

## 2019-12-11 DIAGNOSIS — E1129 Type 2 diabetes mellitus with other diabetic kidney complication: Secondary | ICD-10-CM | POA: Diagnosis not present

## 2019-12-11 DIAGNOSIS — Z299 Encounter for prophylactic measures, unspecified: Secondary | ICD-10-CM | POA: Diagnosis not present

## 2019-12-13 DIAGNOSIS — E1101 Type 2 diabetes mellitus with hyperosmolarity with coma: Secondary | ICD-10-CM | POA: Diagnosis not present

## 2019-12-16 DIAGNOSIS — H2513 Age-related nuclear cataract, bilateral: Secondary | ICD-10-CM | POA: Diagnosis not present

## 2019-12-16 DIAGNOSIS — Z7984 Long term (current) use of oral hypoglycemic drugs: Secondary | ICD-10-CM | POA: Diagnosis not present

## 2019-12-16 DIAGNOSIS — H04123 Dry eye syndrome of bilateral lacrimal glands: Secondary | ICD-10-CM | POA: Diagnosis not present

## 2019-12-16 DIAGNOSIS — Z79899 Other long term (current) drug therapy: Secondary | ICD-10-CM | POA: Diagnosis not present

## 2019-12-16 DIAGNOSIS — E119 Type 2 diabetes mellitus without complications: Secondary | ICD-10-CM | POA: Diagnosis not present

## 2019-12-24 DIAGNOSIS — M159 Polyosteoarthritis, unspecified: Secondary | ICD-10-CM | POA: Diagnosis not present

## 2019-12-24 DIAGNOSIS — I10 Essential (primary) hypertension: Secondary | ICD-10-CM | POA: Diagnosis not present

## 2019-12-24 DIAGNOSIS — E119 Type 2 diabetes mellitus without complications: Secondary | ICD-10-CM | POA: Diagnosis not present

## 2019-12-31 DIAGNOSIS — S025XXA Fracture of tooth (traumatic), initial encounter for closed fracture: Secondary | ICD-10-CM | POA: Diagnosis not present

## 2019-12-31 DIAGNOSIS — Z299 Encounter for prophylactic measures, unspecified: Secondary | ICD-10-CM | POA: Diagnosis not present

## 2019-12-31 DIAGNOSIS — M329 Systemic lupus erythematosus, unspecified: Secondary | ICD-10-CM | POA: Diagnosis not present

## 2019-12-31 DIAGNOSIS — E1165 Type 2 diabetes mellitus with hyperglycemia: Secondary | ICD-10-CM | POA: Diagnosis not present

## 2019-12-31 DIAGNOSIS — E1142 Type 2 diabetes mellitus with diabetic polyneuropathy: Secondary | ICD-10-CM | POA: Diagnosis not present

## 2020-01-23 DIAGNOSIS — I10 Essential (primary) hypertension: Secondary | ICD-10-CM | POA: Diagnosis not present

## 2020-01-23 DIAGNOSIS — M159 Polyosteoarthritis, unspecified: Secondary | ICD-10-CM | POA: Diagnosis not present

## 2020-01-23 DIAGNOSIS — E119 Type 2 diabetes mellitus without complications: Secondary | ICD-10-CM | POA: Diagnosis not present

## 2020-02-01 DIAGNOSIS — G4733 Obstructive sleep apnea (adult) (pediatric): Secondary | ICD-10-CM | POA: Diagnosis not present

## 2020-02-03 ENCOUNTER — Other Ambulatory Visit: Payer: Self-pay | Admitting: *Deleted

## 2020-02-03 DIAGNOSIS — M359 Systemic involvement of connective tissue, unspecified: Secondary | ICD-10-CM

## 2020-02-03 MED ORDER — HYDROXYCHLOROQUINE SULFATE 200 MG PO TABS: 200.0000 mg | ORAL_TABLET | Freq: Two times a day (BID) | ORAL | 0 refills | Status: DC

## 2020-02-03 NOTE — Telephone Encounter (Signed)
RX faxed from [facility].  Last Visit: 11/14/2019 Next Visit: 04/16/2020 Labs: 11/14/2019, All the labs are stable. Do not indicate active lupus. No change in treatment advised. Eye exam: 12/16/2019 WNL,   Current Dose per office note  11/14/2019, Plaquenil 200 mg 1 tablet by mouth twice daily  DX:  Autoimmune disease   Okay to refill Plaquenil?

## 2020-02-24 DIAGNOSIS — G4733 Obstructive sleep apnea (adult) (pediatric): Secondary | ICD-10-CM | POA: Diagnosis not present

## 2020-02-26 DIAGNOSIS — I10 Essential (primary) hypertension: Secondary | ICD-10-CM | POA: Diagnosis not present

## 2020-02-26 DIAGNOSIS — M2550 Pain in unspecified joint: Secondary | ICD-10-CM | POA: Diagnosis not present

## 2020-02-26 DIAGNOSIS — R030 Elevated blood-pressure reading, without diagnosis of hypertension: Secondary | ICD-10-CM | POA: Diagnosis not present

## 2020-02-26 DIAGNOSIS — Z789 Other specified health status: Secondary | ICD-10-CM | POA: Diagnosis not present

## 2020-02-26 DIAGNOSIS — H9319 Tinnitus, unspecified ear: Secondary | ICD-10-CM | POA: Diagnosis not present

## 2020-02-26 DIAGNOSIS — Z299 Encounter for prophylactic measures, unspecified: Secondary | ICD-10-CM | POA: Diagnosis not present

## 2020-02-26 DIAGNOSIS — E1165 Type 2 diabetes mellitus with hyperglycemia: Secondary | ICD-10-CM | POA: Diagnosis not present

## 2020-02-26 DIAGNOSIS — Z79899 Other long term (current) drug therapy: Secondary | ICD-10-CM | POA: Diagnosis not present

## 2020-03-03 DIAGNOSIS — G4733 Obstructive sleep apnea (adult) (pediatric): Secondary | ICD-10-CM | POA: Diagnosis not present

## 2020-03-18 DIAGNOSIS — G4733 Obstructive sleep apnea (adult) (pediatric): Secondary | ICD-10-CM | POA: Diagnosis not present

## 2020-03-31 DIAGNOSIS — G4733 Obstructive sleep apnea (adult) (pediatric): Secondary | ICD-10-CM | POA: Diagnosis not present

## 2020-04-03 NOTE — Progress Notes (Signed)
Office Visit Note  Patient: Daryl Bell             Date of Birth: 02-Sep-1950           MRN: [id number]             PCP: Monico Blitz, MD Referring: Monico Blitz, MD Visit Date: 04/16/2020 Occupation: @GUAROCC @  Subjective:  Medication monitoring   History of Present Illness: Daryl Bell is a 70 y.o. male with history of autoimmune disease and osteoarthritis.  He is taking plaquenil 200 mg 1 tablet by mouth twice daily.  He is tolerating Plaquenil without any side effects.  He denies any signs or symptoms of a flare.  Patient reports that he continues to have chronic pain in the left shoulder and plans on following up with his orthopedist.  He states that his hand pain and stiffness have improved since starting to wear arthritis compression gloves as well as a left CMC joint brace for support.  He has started walking 1 to 2 miles on a daily basis which has helped with his energy level and overall joint stiffness.  He is trying to reach a goal weight off 190 pounds.  He denies any SOB.  He remains somewhat symptomatic from ongoing reflux as well as from a hiatal hernia.  He plans on following up with his gastroenterologist next week.  He has been taking Protonix as prescribed and has been sleeping in a raise position.  He states his energy level has improved significantly since starting to use a CPAP.  He has been sleeping well since taking trazodone 100 mg at bedtime. He denies any rashes currently.  He states he noticed a small area of redness on the anterior surface of his right elbow that looks like a blood vessel but has not noticed any other skin lesions.  He continues to have photosensitivity and tries to wear sun protective clothing.  He denies any sores in his mouth or nose.  He continues to have eye dryness and uses systane eye drops which alleviate his symptoms.     He denies any recent infections.  He received the moderna booster dose on 12/24/19.      Activities of  Daily Living:  Patient reports morning stiffness for 1-2 hours.   Patient Denies nocturnal pain.  Difficulty dressing/grooming: Denies Difficulty climbing stairs: Denies Difficulty getting out of chair: Denies Difficulty using hands for taps, buttons, cutlery, and/or writing: Denies  Review of Systems  Constitutional: Negative for fatigue.  HENT: Negative for mouth sores, mouth dryness and nose dryness.   Eyes: Positive for dryness. Negative for pain and itching.  Respiratory: Negative for shortness of breath and difficulty breathing.   Cardiovascular: Negative for chest pain and palpitations.  Gastrointestinal: Negative for blood in stool, constipation and diarrhea.  Endocrine: Negative for increased urination.  Genitourinary: Negative for difficulty urinating.  Musculoskeletal: Positive for arthralgias, joint pain and morning stiffness. Negative for joint swelling, myalgias, muscle tenderness and myalgias.  Skin: Negative for color change, rash and redness.  Allergic/Immunologic: Negative for susceptible to infections.  Neurological: Positive for numbness and headaches. Negative for dizziness, memory loss and weakness.  Hematological: Positive for bruising/bleeding tendency.  Psychiatric/Behavioral: Negative for confusion.    PMFS History:  Patient Active Problem List   Diagnosis Date Noted  . Neck pain 03/31/2016  . Acute pain of left shoulder 03/31/2016  . ANA positive 12/29/2015  . 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 Photosensitivity 12/29/2015  .  High risk medication use 12/29/2015  . Other dysphagia 02/13/2013  . Encounter for screening colonoscopy 02/13/2013    Past Medical History:  Diagnosis Date  . Arthritis   . Diabetes ([place])   . GERD (gastroesophageal reflux disease)   . Hypercholesterolemia   . Hypertension   . Kidney calculus     Family History  Problem Relation Age of Onset  . Dementia Father   . Colon cancer Neg Hx    Past Surgical History:   Procedure Laterality Date  . COLONOSCOPY, ESOPHAGOGASTRODUODENOSCOPY (EGD) AND ESOPHAGEAL DILATION N/A 03/07/2013   Procedure: COLONOSCOPY, ESOPHAGOGASTRODUODENOSCOPY (EGD) AND ESOPHAGEAL DILATION;  Surgeon: Daneil Dolin, MD;  Location[facility];  Service: Endoscopy;  Laterality: N/A;  11:15  . ESOPHAGOGASTRODUODENOSCOPY     remote past in the 1990s with dilation  . KIDNEY STONE SURGERY  2011   Social History   Social History Narrative  . Not on file   Immunization History  Administered Date(s) Administered  . Influenza, High Dose Seasonal PF 10/25/2017, 11/15/2018  . Moderna Sars-Covid-2 Vaccination 03/21/2019, 04/18/2019  . Pneumococcal Polysaccharide-23 11/15/2018  . Zoster Recombinat (Shingrix) 06/25/2017, 10/25/2017     Objective: Vital Signs: BP 119/81 (BP Location: Left Arm, Patient Position: Sitting, Cuff Size: Normal)   Pulse 91   Resp 16   Ht 5' 7"  (1.702 m)   Wt 217 lb 12.8 oz (98.8 kg)   BMI 34.11 kg/m    Physical Exam Vitals and nursing note reviewed.  Constitutional:      Appearance: He is well-developed.  HENT:     Head: Normocephalic and atraumatic.  Eyes:     Conjunctiva/sclera: Conjunctivae normal.     Pupils: Pupils are equal, round, and reactive to light.  Pulmonary:     Effort: Pulmonary effort is normal.  Abdominal:     Palpations: Abdomen is soft.  Musculoskeletal:     Cervical back: Normal range of motion and neck supple.  Skin:    General: Skin is warm and dry.     Capillary Refill: Capillary refill takes less than 2 seconds.  Neurological:     Mental Status: He is alert and oriented to person, place, and time.  Psychiatric:        Behavior: Behavior normal.      Musculoskeletal Exam: C-spine, thoracic spine, lumbar spine have good range of motion with no discomfort.  Shoulder joints, elbow joints, wrist joints, MCPs, PIPs, DIPs have good range of motion with no synovitis.  Left CMC joint prominence and tenderness noted.  PIP and  DIP thickening consistent with osteoarthritis of both hands.  Hip joints have good range of motion with no discomfort.  Knee joints have good range of motion with no warmth or effusion.  Ankle joints have good range of motion with no tenderness or inflammation.  CDAI Exam: CDAI Score: -- Patient Global: --; Provider Global: -- Swollen: 0 ; Tender: 0  Joint Exam 04/16/2020   No joint exam has been documented for this visit   There is currently no information documented on the homunculus. Go to the Rheumatology activity and complete the homunculus joint exam.  Investigation: No additional findings.  Imaging: No results found.  Recent Labs: Lab Results  Component Value Date   WBC 7.7 11/14/2019   HGB 14.7 11/14/2019   PLT 192 11/14/2019   NA 140 11/14/2019   K 3.8 11/14/2019   CL 101 11/14/2019   CO2 31 11/14/2019   GLUCOSE 115 (H) 11/14/2019   BUN  17 11/14/2019   CREATININE 0.95 11/14/2019   BILITOT 0.4 11/14/2019   ALKPHOS 46 09/01/2016   AST 17 11/14/2019   ALT 17 11/14/2019   PROT 6.1 11/14/2019   ALBUMIN 4.2 09/01/2016   CALCIUM 8.8 11/14/2019   GFRAA 96 11/14/2019    Speciality Comments: PLQ Eye Exam 12/16/2019  WNL   Procedures:  No procedures performed Allergies: Patient has no known allergies.   Assessment / Plan:     Visit Diagnoses: Autoimmune disease ([place]) - Positive ANA, positive double-stranded DNA, history of photosensitivity: He has not had any signs or symptoms of a flare.  He is clinically doing well taking Plaquenil 200 mg 1 tablet by mouth twice daily.  He continues to tolerate Plaquenil without any side effects and has not missed any doses recently.  Lab work from 11/14/2019 was reviewed with the patient today in the office: Complements within normal limits, double-stranded DNA 5, ANA negative, and ESR was 2.  We will repeat the following lab work today.  He continues to experience intermittent pain and stiffness in both hands but has started to wear  arthritis compression gloves at night and throughout the day as needed which has been alleviated his discomfort.  He is also been wearing a left CMC joint brace for support which has improved his symptoms.  No synovitis was noted on examination today.  He continues to have ongoing photosensitivity and was encouraged to avoid direct sun exposure, wear sun protective clothing, and wear sunscreen SPF greater than 50 on a daily basis.  He has not had any symptoms of Raynaud's phenomenon and no digital ulcerations or signs of sclerodactyly were noted.  He has not had any oral or nasal ulcerations.  He experiences chronic dry eyes and uses Systane eyedrops which alleviate his symptoms.  He has not had any mouth dryness.  He has not noticed any pleuritic chest pain or shortness of breath.  His energy level has improved significantly since increasing his exercise regimen to 1 to 2 miles per day as well as using his CPAP at night.  He will continue taking Plaquenil 200 mg 1 tablet by mouth twice daily.  A refill of Plaquenil sent to the pharmacy today.  He was advised to notify us if he develops any new or worsening symptoms.  He will follow-up in the office in 5 months.- Plan: CBC with Differential/Platelet, COMPLETE METABOLIC PANEL WITH GFR, Urinalysis, Routine w reflex microscopic, Anti-DNA antibody, double-stranded, C3 and C4, Sedimentation rate, hydroxychloroquine (PLAQUENIL) 200 MG tablet  High risk medication use - Plaquenil 200 mg 1 tablet by mouth twice daily. PLQ Eye Exam 12/16/2019.  CBC and CMP were updated on 11/14/2019.  He is due to update lab work.  Orders for CBC and CMP were released.- Plan: CBC with Differential/Platelet, COMPLETE METABOLIC PANEL WITH GFR He has not had any recent infections. He has received 3 moderna covid-19 vaccine doses.   Primary osteoarthritis of both hands: He has PIP and DIP thickening consistent with osteoarthritis of both hands.  CMC joint prominence noted bilaterally.  He  has tenderness location over the left [facility] joint.  He has been wearing a left CMC joint brace on a daily basis for support.  He is also been using arthritis compression gloves especially at night which improves his hand pain and stiffness.  We discussed the importance of joint protection and muscle strengthening.  He will continue taking natural anti-inflammatories including turmeric, fish oil, tart cherry, and ginger.  Arthritis of  carpometacarpal [facility]) joint of left thumb: He experiences intermittent pain and stiffness in his left CMC joint.  He has thickening and tenderness to palpation on examination today.  He wears a left CMC joint brace on a daily basis for support.  He has also been wearing arthritis compression gloves at night which has improved his joint stiffness and discomfort.  Trochanteric bursitis of both hips: Resolved.  He has good ROM of both hip joints with no groin pain.  No tenderness to palpation over bilateral trochanter bursa.   Chronic left shoulder pain: Chronic pain.  He has good ROM of the left shoulder joint with some discomfort and stiffness.  Tenderness to palpable over the left subacromial bursa.  He plans on following up with his orthopedist.   Other medical conditions are listed as follows:  History of diabetes mellitus  History of gastroesophageal reflux (GERD): He is taking Protonix 40 mg by mouth daily.  He will be following up with his gastroenterologist next week to discuss any symptoms he has been experiencing.  History of hypertension: BP was 119/81 today in the office.   Renal calcinosis  History of hypercholesterolemia  History of sleep apnea: Uses CPAP.    Orders: Orders Placed This Encounter  Procedures  . CBC with Differential/Platelet  . COMPLETE METABOLIC PANEL WITH GFR  . Urinalysis, Routine w reflex microscopic  . Anti-DNA antibody, double-stranded  . C3 and C4  . Sedimentation rate   Meds ordered this encounter  Medications  .  hydroxychloroquine (PLAQUENIL) 200 MG tablet    Sig: Take 1 tablet (200 mg total) by mouth 2 (two) times daily.    Dispense:  180 tablet    Refill:  0     Follow-Up Instructions: Return in about 5 months (around 09/16/2020) for Autoimmune Disease, Osteoarthritis.   Ofilia Neas, PA-C  Note - This record has been created using Dragon software.  Chart creation errors have been sought, but may not always  have been located. Such creation errors do not reflect on  the standard of medical care.

## 2020-04-16 ENCOUNTER — Ambulatory Visit (INDEPENDENT_AMBULATORY_CARE_PROVIDER_SITE_OTHER): Payer: Medicare Other | Admitting: Physician Assistant

## 2020-04-16 ENCOUNTER — Other Ambulatory Visit: Payer: Self-pay

## 2020-04-16 ENCOUNTER — Other Ambulatory Visit: Payer: Self-pay | Admitting: Physician Assistant

## 2020-04-16 ENCOUNTER — Encounter: Payer: Self-pay | Admitting: Physician Assistant

## 2020-04-16 VITALS — BP 119/81 | HR 91 | Resp 16 | Ht 67.0 in | Wt 217.8 lb

## 2020-04-16 DIAGNOSIS — Z8679 Personal history of other diseases of the circulatory system: Secondary | ICD-10-CM | POA: Diagnosis not present

## 2020-04-16 DIAGNOSIS — M1812 Unilateral primary osteoarthritis of first carpometacarpal joint, left hand: Secondary | ICD-10-CM

## 2020-04-16 DIAGNOSIS — M19041 Primary osteoarthritis, right hand: Secondary | ICD-10-CM | POA: Diagnosis not present

## 2020-04-16 DIAGNOSIS — M7061 Trochanteric bursitis, right hip: Secondary | ICD-10-CM

## 2020-04-16 DIAGNOSIS — M19042 Primary osteoarthritis, left hand: Secondary | ICD-10-CM | POA: Diagnosis not present

## 2020-04-16 DIAGNOSIS — M359 Systemic involvement of connective tissue, unspecified: Secondary | ICD-10-CM

## 2020-04-16 DIAGNOSIS — M25512 Pain in left shoulder: Secondary | ICD-10-CM | POA: Diagnosis not present

## 2020-04-16 DIAGNOSIS — Z8639 Personal history of other endocrine, nutritional and metabolic disease: Secondary | ICD-10-CM

## 2020-04-16 DIAGNOSIS — Z79899 Other long term (current) drug therapy: Secondary | ICD-10-CM

## 2020-04-16 DIAGNOSIS — Z8719 Personal history of other diseases of the digestive system: Secondary | ICD-10-CM

## 2020-04-16 DIAGNOSIS — M7062 Trochanteric bursitis, left hip: Secondary | ICD-10-CM

## 2020-04-16 DIAGNOSIS — Z8669 Personal history of other diseases of the nervous system and sense organs: Secondary | ICD-10-CM | POA: Diagnosis not present

## 2020-04-16 DIAGNOSIS — N29 Other disorders of kidney and ureter in diseases classified elsewhere: Secondary | ICD-10-CM

## 2020-04-16 DIAGNOSIS — G8929 Other chronic pain: Secondary | ICD-10-CM

## 2020-04-16 MED ORDER — HYDROXYCHLOROQUINE SULFATE 200 MG PO TABS
200.0000 mg | ORAL_TABLET | Freq: Two times a day (BID) | ORAL | 0 refills | Status: DC
Start: 2020-04-16 — End: 2020-07-13

## 2020-04-17 LAB — URINALYSIS, ROUTINE W REFLEX MICROSCOPIC
Bilirubin Urine: NEGATIVE
Glucose, UA: NEGATIVE
Hgb urine dipstick: NEGATIVE
Ketones, ur: NEGATIVE
Leukocytes,Ua: NEGATIVE
Nitrite: NEGATIVE
Protein, ur: NEGATIVE
Specific Gravity, Urine: 1.017 (ref 1.001–1.03)
pH: 6.5 (ref 5.0–8.0)

## 2020-04-17 LAB — SEDIMENTATION RATE: Sed Rate: 6 mm/h (ref 0–20)

## 2020-04-17 LAB — CBC WITH DIFFERENTIAL/PLATELET
Absolute Monocytes: 738 cells/uL (ref 200–950)
Basophils Absolute: 131 cells/uL (ref 0–200)
Basophils Relative: 1.6 %
Eosinophils Absolute: 221 cells/uL (ref 15–500)
Eosinophils Relative: 2.7 %
HCT: 45.5 % (ref 38.5–50.0)
Hemoglobin: 15.5 g/dL (ref 13.2–17.1)
Lymphs Abs: 2936 cells/uL (ref 850–3900)
MCH: 31.1 pg (ref 27.0–33.0)
MCHC: 34.1 g/dL (ref 32.0–36.0)
MCV: 91.4 fL (ref 80.0–100.0)
MPV: 11.2 fL (ref 7.5–12.5)
Monocytes Relative: 9 %
Neutro Abs: 4174 cells/uL (ref 1500–7800)
Neutrophils Relative %: 50.9 %
Platelets: 247 10*3/uL (ref 140–400)
RBC: 4.98 10*6/uL (ref 4.20–5.80)
RDW: 12.8 % (ref 11.0–15.0)
Total Lymphocyte: 35.8 %
WBC: 8.2 10*3/uL (ref 3.8–10.8)

## 2020-04-17 LAB — COMPLETE METABOLIC PANEL WITH GFR
AG Ratio: 2.1 (calc) (ref 1.0–2.5)
ALT: 20 U/L (ref 9–46)
AST: 18 U/L (ref 10–35)
Albumin: 4.4 g/dL (ref 3.6–5.1)
Alkaline phosphatase (APISO): 44 U/L (ref 35–144)
BUN: 14 mg/dL (ref 7–25)
CO2: 29 mmol/L (ref 20–32)
Calcium: 9 mg/dL (ref 8.6–10.3)
Chloride: 101 mmol/L (ref 98–110)
Creat: 0.97 mg/dL (ref 0.70–1.25)
GFR, Est African American: 93 mL/min/{1.73_m2} (ref >=60)
GFR, Est Non African American: 80 mL/min/{1.73_m2} (ref >=60)
Globulin: 2.1 g/dL (calc) (ref 1.9–3.7)
Glucose, Bld: 103 mg/dL — ABNORMAL HIGH (ref 65–99)
Potassium: 4.1 mmol/L (ref 3.5–5.3)
Sodium: 140 mmol/L (ref 135–146)
Total Bilirubin: 0.5 mg/dL (ref 0.2–1.2)
Total Protein: 6.5 g/dL (ref 6.1–8.1)

## 2020-04-17 LAB — ANTI-DNA ANTIBODY, DOUBLE-STRANDED: ds DNA Ab: 4 IU/mL

## 2020-04-17 LAB — C3 AND C4
C3 Complement: 122 mg/dL (ref 82–185)
C4 Complement: 24 mg/dL (ref 15–53)

## 2020-04-17 NOTE — Progress Notes (Signed)
CBC and CMP WNL.  ESR and complements WNL.  UA normal. dsDNA pending.

## 2020-04-17 NOTE — Progress Notes (Signed)
dsDNA is negative.  Labs are not consistent with a flare.  No further recommendations at this time.

## 2020-05-01 DIAGNOSIS — G4733 Obstructive sleep apnea (adult) (pediatric): Secondary | ICD-10-CM | POA: Diagnosis not present

## 2020-05-27 DIAGNOSIS — G4733 Obstructive sleep apnea (adult) (pediatric): Secondary | ICD-10-CM | POA: Diagnosis not present

## 2020-05-28 DIAGNOSIS — Z299 Encounter for prophylactic measures, unspecified: Secondary | ICD-10-CM | POA: Diagnosis not present

## 2020-05-28 DIAGNOSIS — I10 Essential (primary) hypertension: Secondary | ICD-10-CM | POA: Diagnosis not present

## 2020-05-28 DIAGNOSIS — Z789 Other specified health status: Secondary | ICD-10-CM | POA: Diagnosis not present

## 2020-05-28 DIAGNOSIS — R52 Pain, unspecified: Secondary | ICD-10-CM | POA: Diagnosis not present

## 2020-05-28 DIAGNOSIS — E1165 Type 2 diabetes mellitus with hyperglycemia: Secondary | ICD-10-CM | POA: Diagnosis not present

## 2020-05-28 DIAGNOSIS — E1142 Type 2 diabetes mellitus with diabetic polyneuropathy: Secondary | ICD-10-CM | POA: Diagnosis not present

## 2020-05-28 DIAGNOSIS — M329 Systemic lupus erythematosus, unspecified: Secondary | ICD-10-CM | POA: Diagnosis not present

## 2020-05-31 DIAGNOSIS — G4733 Obstructive sleep apnea (adult) (pediatric): Secondary | ICD-10-CM | POA: Diagnosis not present

## 2020-07-01 DIAGNOSIS — G4733 Obstructive sleep apnea (adult) (pediatric): Secondary | ICD-10-CM | POA: Diagnosis not present

## 2020-07-13 ENCOUNTER — Other Ambulatory Visit: Payer: Self-pay | Admitting: Physician Assistant

## 2020-07-13 DIAGNOSIS — M359 Systemic involvement of connective tissue, unspecified: Secondary | ICD-10-CM

## 2020-07-13 NOTE — Telephone Encounter (Signed)
Last Visit: 04/16/2020  Next Visit: 09/17/2020  Labs: 04/16/2020 CBC and CMP WNL  Eye exam: 12/16/2019   Current Dose per office note 04/16/2020: Plaquenil 200 mg 1 tablet by mouth twice daily  DC:VUDTHYHOOI disease  Last Fill: 04/16/2020  Okay to refill Plaquenil?

## 2020-07-31 DIAGNOSIS — G4733 Obstructive sleep apnea (adult) (pediatric): Secondary | ICD-10-CM | POA: Diagnosis not present

## 2020-08-25 DIAGNOSIS — G4733 Obstructive sleep apnea (adult) (pediatric): Secondary | ICD-10-CM | POA: Diagnosis not present

## 2020-08-26 DIAGNOSIS — G8929 Other chronic pain: Secondary | ICD-10-CM | POA: Diagnosis not present

## 2020-08-26 DIAGNOSIS — Z299 Encounter for prophylactic measures, unspecified: Secondary | ICD-10-CM | POA: Diagnosis not present

## 2020-08-31 DIAGNOSIS — G4733 Obstructive sleep apnea (adult) (pediatric): Secondary | ICD-10-CM | POA: Diagnosis not present

## 2020-09-02 DIAGNOSIS — M2550 Pain in unspecified joint: Secondary | ICD-10-CM | POA: Diagnosis not present

## 2020-09-02 DIAGNOSIS — I10 Essential (primary) hypertension: Secondary | ICD-10-CM | POA: Diagnosis not present

## 2020-09-02 DIAGNOSIS — E1165 Type 2 diabetes mellitus with hyperglycemia: Secondary | ICD-10-CM | POA: Diagnosis not present

## 2020-09-02 DIAGNOSIS — R030 Elevated blood-pressure reading, without diagnosis of hypertension: Secondary | ICD-10-CM | POA: Diagnosis not present

## 2020-09-02 DIAGNOSIS — Z299 Encounter for prophylactic measures, unspecified: Secondary | ICD-10-CM | POA: Diagnosis not present

## 2020-09-04 NOTE — Progress Notes (Signed)
Office Visit Note  Patient: Daryl Bell             Date of Birth: 09-May-1950           MRN: [id number]             PCP: Monico Blitz, MD Referring: Monico Blitz, MD Visit Date: 09/17/2020 Occupation: '@GUAROCC'$ @  Subjective:  Joint  stiffness.   History of Present Illness: Daryl Bell is a 70 y.o. male with a history of autoimmune disease and osteoarthritis.  He denies any history of oral ulcers, nasal ulcers, malar rash or lymphadenopathy.  He noticed some bruising recently and stopped taking aspirin.  He noticed improvement in the bruising.  He denies any joint swelling.  Activities of Daily Living:  Patient reports morning stiffness for 1 hour.   Patient Denies nocturnal pain.  Difficulty dressing/grooming: Denies Difficulty climbing stairs: Denies Difficulty getting out of chair: Denies Difficulty using hands for taps, buttons, cutlery, and/or writing: Denies  Review of Systems  Constitutional:  Negative for fatigue.  HENT:  Positive for mouth dryness. Negative for mouth sores and nose dryness.   Eyes:  Positive for visual disturbance. Negative for pain, itching and dryness.  Respiratory:  Negative for cough, hemoptysis, shortness of breath and difficulty breathing.   Cardiovascular:  Negative for chest pain, palpitations and swelling in legs/feet.  Gastrointestinal:  Negative for abdominal pain, blood in stool, constipation and diarrhea.  Endocrine: Negative for increased urination.  Genitourinary:  Negative for painful urination.  Musculoskeletal:  Positive for joint pain, joint pain and morning stiffness. Negative for joint swelling, myalgias, muscle weakness, muscle tenderness and myalgias.  Skin:  Positive for sensitivity to sunlight. Negative for color change, rash and redness.  Allergic/Immunologic: Negative for susceptible to infections.  Neurological:  Negative for dizziness, numbness, headaches, memory loss and weakness.  Hematological:  Negative  for swollen glands.  Psychiatric/Behavioral:  Negative for confusion and sleep disturbance.    PMFS History:  Patient Active Problem List   Diagnosis Date Noted   Neck pain 03/31/2016   Acute pain of left shoulder 03/31/2016   ANA positive 12/29/2015   Ds DNA antibody positive 12/29/2015   Photosensitivity 12/29/2015   High risk medication use 12/29/2015   Other dysphagia 02/13/2013   Encounter for screening colonoscopy 02/13/2013    Past Medical History:  Diagnosis Date   Arthritis    Diabetes ([place])    GERD (gastroesophageal reflux disease)    Hypercholesterolemia    Hypertension    Kidney calculus     Family History  Problem Relation Age of Onset   Dementia Father    Colon cancer Neg Hx    Past Surgical History:  Procedure Laterality Date   COLONOSCOPY, ESOPHAGOGASTRODUODENOSCOPY (EGD) AND ESOPHAGEAL DILATION N/A 03/07/2013   Procedure: COLONOSCOPY, ESOPHAGOGASTRODUODENOSCOPY (EGD) AND ESOPHAGEAL DILATION;  Surgeon: Daneil Dolin, MD;  [facility];  Service: Endoscopy;  Laterality: N/A;  11:15   ESOPHAGOGASTRODUODENOSCOPY     remote past in the 1990s with dilation   KIDNEY STONE SURGERY  2011   Social History   Social History Narrative   Not on file   Immunization History  Administered Date(s) Administered   Influenza, High Dose Seasonal PF 10/25/2017, 11/15/2018   Moderna Sars-Covid-2 Vaccination 03/21/2019, 04/18/2019, 12/24/2019   Pneumococcal Polysaccharide-23 11/15/2018   Zoster Recombinat (Shingrix) 06/25/2017, 10/25/2017     Objective: Vital Signs: BP 124/85 (BP Location: Left Arm, Patient Position: Sitting, Cuff Size: Normal)   Pulse 79  Ht 5' 7.5" (1.715 m)   Wt 214 lb 6.4 oz (97.3 kg)   BMI 33.08 kg/m    Physical Exam Vitals and nursing note reviewed.  Constitutional:      Appearance: He is well-developed.  HENT:     Head: Normocephalic and atraumatic.  Eyes:     Conjunctiva/sclera: Conjunctivae normal.     Pupils: Pupils are  equal, round, and reactive to light.  Cardiovascular:     Rate and Rhythm: Normal rate and regular rhythm.     Heart sounds: Normal heart sounds.  Pulmonary:     Effort: Pulmonary effort is normal.     Breath sounds: Normal breath sounds.  Abdominal:     General: Bowel sounds are normal.     Palpations: Abdomen is soft.  Musculoskeletal:     Cervical back: Normal range of motion and neck supple.  Skin:    General: Skin is warm and dry.     Capillary Refill: Capillary refill takes less than 2 seconds.  Neurological:     Mental Status: He is alert and oriented to person, place, and time.  Psychiatric:        Behavior: Behavior normal.     Musculoskeletal Exam: C-spine was in good range of motion.  He had good mobility in his thoracic and lumbar region.  Shoulder joints, elbow joints, wrist joints with good range of motion.  He had bilateral PIP and DIP thickening in his hands.  Hip joints and knee joints in good range of motion without joint swelling.  He no tenderness over ankles or MTPs.  CDAI Exam: CDAI Score: -- Patient Global: --; Provider Global: -- Swollen: --; Tender: -- Joint Exam 09/17/2020   No joint exam has been documented for this visit   There is currently no information documented on the homunculus. Go to the Rheumatology activity and complete the homunculus joint exam.  Investigation: No additional findings.  Imaging: No results found.  Recent Labs: Lab Results  Component Value Date   WBC 8.2 04/16/2020   HGB 15.5 04/16/2020   PLT 247 04/16/2020   NA 140 04/16/2020   K 4.1 04/16/2020   CL 101 04/16/2020   CO2 29 04/16/2020   GLUCOSE 103 (H) 04/16/2020   BUN 14 04/16/2020   CREATININE 0.97 04/16/2020   BILITOT 0.5 04/16/2020   ALKPHOS 46 09/01/2016   AST 18 04/16/2020   ALT 20 04/16/2020   PROT 6.5 04/16/2020   ALBUMIN 4.2 09/01/2016   CALCIUM 9.0 04/16/2020   GFRAA 93 04/16/2020    Speciality Comments: PLQ Eye Exam 06/04/2020  [place], [place]  Procedures:  No procedures performed Allergies: Patient has no known allergies.   Assessment / Plan:     Visit Diagnoses: Autoimmune disease ([place]) - Positive ANA, positive double-stranded DNA, history of photosensitivity: -He gives history of photosensitivity.  There is no history of oral ulcers, nasal ulcers, malar rash, lymphadenopathy.  He had no synovitis on examination.  Will obtain labs today.  Plan: Urinalysis, Routine w reflex microscopic, C3 and C4, Sedimentation rate, Anti-DNA antibody, double-stranded  High risk medication use - Plaquenil 200 mg 1 tablet by mouth twice daily. PLQ Eye Exam 06/04/2020 - Plan: CBC with Differential/Platelet, COMPLETE METABOLIC PANEL WITH GFR  Primary osteoarthritis of both hands-joint protection muscle strengthening was discussed.  Arthritis of carpometacarpal (CMC) joint of left thumb-he has been using a brace which has been helpful.  He is also working out on a regular basis.  Trochanteric bursitis of both hips -he has off-and-on discomfort.  IT band stretches were demonstrated.  Chronic left shoulder pain-he has off-and-on discomfort in his left shoulder.  The shoulder joint is better today.  Other medical problems are listed as follows:  History of diabetes mellitus  History of hypertension  History of gastroesophageal reflux (GERD)  Renal calcinosis  History of hypercholesterolemia  History of sleep apnea - Uses CPAP.   Orders: Orders Placed This Encounter  Procedures   CBC with Differential/Platelet   COMPLETE METABOLIC PANEL WITH GFR   Urinalysis, Routine w reflex microscopic   C3 and C4   Sedimentation rate   Anti-DNA antibody, double-stranded    No orders of the defined types were placed in this encounter.   Follow-Up Instructions: Return for Autoimmune disease.   Bo Merino, MD  Note - This record has been created using Editor, commissioning.  Chart creation errors have been sought, but may not  always  have been located. Such creation errors do not reflect on  the standard of medical care.

## 2020-09-17 ENCOUNTER — Ambulatory Visit (INDEPENDENT_AMBULATORY_CARE_PROVIDER_SITE_OTHER): Payer: Medicare Other | Admitting: Rheumatology

## 2020-09-17 ENCOUNTER — Other Ambulatory Visit: Payer: Self-pay

## 2020-09-17 ENCOUNTER — Encounter: Payer: Self-pay | Admitting: Rheumatology

## 2020-09-17 VITALS — BP 124/85 | HR 79 | Ht 67.5 in | Wt 214.4 lb

## 2020-09-17 DIAGNOSIS — M19042 Primary osteoarthritis, left hand: Secondary | ICD-10-CM

## 2020-09-17 DIAGNOSIS — M19041 Primary osteoarthritis, right hand: Secondary | ICD-10-CM

## 2020-09-17 DIAGNOSIS — M1812 Unilateral primary osteoarthritis of first carpometacarpal joint, left hand: Secondary | ICD-10-CM

## 2020-09-17 DIAGNOSIS — M359 Systemic involvement of connective tissue, unspecified: Secondary | ICD-10-CM

## 2020-09-17 DIAGNOSIS — Z8669 Personal history of other diseases of the nervous system and sense organs: Secondary | ICD-10-CM | POA: Diagnosis not present

## 2020-09-17 DIAGNOSIS — M7061 Trochanteric bursitis, right hip: Secondary | ICD-10-CM | POA: Diagnosis not present

## 2020-09-17 DIAGNOSIS — Z8639 Personal history of other endocrine, nutritional and metabolic disease: Secondary | ICD-10-CM | POA: Diagnosis not present

## 2020-09-17 DIAGNOSIS — G8929 Other chronic pain: Secondary | ICD-10-CM

## 2020-09-17 DIAGNOSIS — Z8679 Personal history of other diseases of the circulatory system: Secondary | ICD-10-CM

## 2020-09-17 DIAGNOSIS — N29 Other disorders of kidney and ureter in diseases classified elsewhere: Secondary | ICD-10-CM

## 2020-09-17 DIAGNOSIS — M25512 Pain in left shoulder: Secondary | ICD-10-CM

## 2020-09-17 DIAGNOSIS — Z8719 Personal history of other diseases of the digestive system: Secondary | ICD-10-CM

## 2020-09-17 DIAGNOSIS — Z79899 Other long term (current) drug therapy: Secondary | ICD-10-CM | POA: Diagnosis not present

## 2020-09-17 DIAGNOSIS — M7062 Trochanteric bursitis, left hip: Secondary | ICD-10-CM

## 2020-09-17 NOTE — Patient Instructions (Signed)
Vaccines You are taking a medication(s) that can suppress your immune system.  The following immunizations are recommended: Flu annually Covid-19  Td/Tdap (tetanus, diphtheria, pertussis) every 10 years Pneumonia (Prevnar 15 then Pneumovax 23 at least 1 year apart.  Alternatively, can take Prevnar 20 without needing additional dose) Shingrix (after age 68): 2 doses from 4 weeks to 6 months apart  Please check with your PCP to make sure you are up to date.   Heart Disease Prevention   Your inflammatory disease increases your risk of heart disease which includes heart attack, stroke, atrial fibrillation (irregular heartbeats), high blood pressure, heart failure and atherosclerosis (plaque in the arteries).  It is important to reduce your risk by:   Keep blood pressure, cholesterol, and blood sugar at healthy levels   Smoking Cessation   Maintain a healthy weight  BMI 20-25   Eat a healthy diet  Plenty of fresh fruit, vegetables, and whole grains  Limit saturated fats, foods high in sodium, and added sugars  DASH and Mediterranean diet   Increase physical activity  Recommend moderate physically activity for 150 minutes per week/ 30 minutes a day for five days a week These can be broken up into three separate ten-minute sessions during the day.   Reduce Stress  Meditation, slow breathing exercises, yoga, coloring books  Dental visits twice a year

## 2020-09-18 LAB — URINALYSIS, ROUTINE W REFLEX MICROSCOPIC
Bacteria, UA: NONE SEEN /HPF
Bilirubin Urine: NEGATIVE
Glucose, UA: NEGATIVE
Hgb urine dipstick: NEGATIVE
Hyaline Cast: NONE SEEN /LPF
Ketones, ur: NEGATIVE
Leukocytes,Ua: NEGATIVE
Nitrite: NEGATIVE
RBC / HPF: NONE SEEN /HPF (ref 0–2)
Specific Gravity, Urine: 1.021 (ref 1.001–1.035)
Squamous Epithelial / HPF: NONE SEEN /HPF (ref <=5)
WBC, UA: NONE SEEN /HPF (ref 0–5)
pH: 6.5 (ref 5.0–8.0)

## 2020-09-18 LAB — CBC WITH DIFFERENTIAL/PLATELET
Absolute Monocytes: 870 cells/uL (ref 200–950)
Basophils Absolute: 110 cells/uL (ref 0–200)
Basophils Relative: 1.1 %
Eosinophils Absolute: 290 cells/uL (ref 15–500)
Eosinophils Relative: 2.9 %
HCT: 45.6 % (ref 38.5–50.0)
Hemoglobin: 15.3 g/dL (ref 13.2–17.1)
Lymphs Abs: 3380 cells/uL (ref 850–3900)
MCH: 30.6 pg (ref 27.0–33.0)
MCHC: 33.6 g/dL (ref 32.0–36.0)
MCV: 91.2 fL (ref 80.0–100.0)
MPV: 10.8 fL (ref 7.5–12.5)
Monocytes Relative: 8.7 %
Neutro Abs: 5350 cells/uL (ref 1500–7800)
Neutrophils Relative %: 53.5 %
Platelets: 212 10*3/uL (ref 140–400)
RBC: 5 10*6/uL (ref 4.20–5.80)
RDW: 12.2 % (ref 11.0–15.0)
Total Lymphocyte: 33.8 %
WBC: 10 10*3/uL (ref 3.8–10.8)

## 2020-09-18 LAB — COMPLETE METABOLIC PANEL WITH GFR
AG Ratio: 2 (calc) (ref 1.0–2.5)
ALT: 26 U/L (ref 9–46)
AST: 21 U/L (ref 10–35)
Albumin: 4.2 g/dL (ref 3.6–5.1)
Alkaline phosphatase (APISO): 52 U/L (ref 35–144)
BUN: 17 mg/dL (ref 7–25)
CO2: 30 mmol/L (ref 20–32)
Calcium: 9 mg/dL (ref 8.6–10.3)
Chloride: 101 mmol/L (ref 98–110)
Creat: 1 mg/dL (ref 0.70–1.35)
Globulin: 2.1 g/dL (calc) (ref 1.9–3.7)
Glucose, Bld: 105 mg/dL — ABNORMAL HIGH (ref 65–99)
Potassium: 4 mmol/L (ref 3.5–5.3)
Sodium: 142 mmol/L (ref 135–146)
Total Bilirubin: 0.5 mg/dL (ref 0.2–1.2)
Total Protein: 6.3 g/dL (ref 6.1–8.1)
eGFR: 82 mL/min/{1.73_m2} (ref >=60)

## 2020-09-18 LAB — ANTI-DNA ANTIBODY, DOUBLE-STRANDED: ds DNA Ab: 4 IU/mL

## 2020-09-18 LAB — C3 AND C4
C3 Complement: 137 mg/dL (ref 82–185)
C4 Complement: 21 mg/dL (ref 15–53)

## 2020-09-18 LAB — SEDIMENTATION RATE: Sed Rate: 2 mm/h (ref 0–20)

## 2020-09-20 NOTE — Progress Notes (Signed)
CBC and CMP are normal.  UA showed trace protein.  Complements, sed rate and double-stranded DNA are normal.  Labs do not indicate active autoimmune disease.

## 2020-09-30 ENCOUNTER — Other Ambulatory Visit: Payer: Self-pay | Admitting: Physician Assistant

## 2020-09-30 DIAGNOSIS — M359 Systemic involvement of connective tissue, unspecified: Secondary | ICD-10-CM

## 2020-09-30 NOTE — Telephone Encounter (Signed)
Next Visit: 02/18/2021  Last Visit: 09/17/2020  Labs: 09/17/2020, CBC and CMP are normal.  UA showed trace protein.  Complements, sed rate and double-stranded DNA are normal.  Labs do not indicate active autoimmune disease  Eye exam: 06/04/2020   Current Dose per office note 09/17/2020: Plaquenil 200 mg 1 tablet by mouth twice daily  [id number] disease   Last Fill: 07/13/2020  Okay to refill Plaquenil?

## 2020-10-01 DIAGNOSIS — G4733 Obstructive sleep apnea (adult) (pediatric): Secondary | ICD-10-CM | POA: Diagnosis not present

## 2020-10-28 DIAGNOSIS — Z23 Encounter for immunization: Secondary | ICD-10-CM | POA: Diagnosis not present

## 2020-10-28 DIAGNOSIS — Z7189 Other specified counseling: Secondary | ICD-10-CM | POA: Diagnosis not present

## 2020-10-28 DIAGNOSIS — Z0000 Encounter for general adult medical examination without abnormal findings: Secondary | ICD-10-CM | POA: Diagnosis not present

## 2020-10-28 DIAGNOSIS — Z79899 Other long term (current) drug therapy: Secondary | ICD-10-CM | POA: Diagnosis not present

## 2020-10-28 DIAGNOSIS — Z299 Encounter for prophylactic measures, unspecified: Secondary | ICD-10-CM | POA: Diagnosis not present

## 2020-10-28 DIAGNOSIS — E7800 Pure hypercholesterolemia, unspecified: Secondary | ICD-10-CM | POA: Diagnosis not present

## 2020-10-28 DIAGNOSIS — R5383 Other fatigue: Secondary | ICD-10-CM | POA: Diagnosis not present

## 2020-10-28 DIAGNOSIS — I10 Essential (primary) hypertension: Secondary | ICD-10-CM | POA: Diagnosis not present

## 2020-10-31 DIAGNOSIS — G4733 Obstructive sleep apnea (adult) (pediatric): Secondary | ICD-10-CM | POA: Diagnosis not present

## 2020-11-05 DIAGNOSIS — Z79899 Other long term (current) drug therapy: Secondary | ICD-10-CM | POA: Diagnosis not present

## 2020-11-19 DIAGNOSIS — H905 Unspecified sensorineural hearing loss: Secondary | ICD-10-CM | POA: Diagnosis not present

## 2020-11-23 DIAGNOSIS — G4733 Obstructive sleep apnea (adult) (pediatric): Secondary | ICD-10-CM | POA: Diagnosis not present

## 2020-12-01 DIAGNOSIS — G4733 Obstructive sleep apnea (adult) (pediatric): Secondary | ICD-10-CM | POA: Diagnosis not present

## 2020-12-09 DIAGNOSIS — E1165 Type 2 diabetes mellitus with hyperglycemia: Secondary | ICD-10-CM | POA: Diagnosis not present

## 2020-12-09 DIAGNOSIS — Z299 Encounter for prophylactic measures, unspecified: Secondary | ICD-10-CM | POA: Diagnosis not present

## 2020-12-09 DIAGNOSIS — I10 Essential (primary) hypertension: Secondary | ICD-10-CM | POA: Diagnosis not present

## 2020-12-09 DIAGNOSIS — R1310 Dysphagia, unspecified: Secondary | ICD-10-CM | POA: Diagnosis not present

## 2020-12-09 DIAGNOSIS — R5383 Other fatigue: Secondary | ICD-10-CM | POA: Diagnosis not present

## 2020-12-31 DIAGNOSIS — G4733 Obstructive sleep apnea (adult) (pediatric): Secondary | ICD-10-CM | POA: Diagnosis not present

## 2021-01-31 DIAGNOSIS — G4733 Obstructive sleep apnea (adult) (pediatric): Secondary | ICD-10-CM | POA: Diagnosis not present

## 2021-02-05 NOTE — Progress Notes (Signed)
Office Visit Note  Patient: Daryl Bell             Date of Birth: 1950/11/26           MRN: [id number]             PCP: Monico Blitz, MD Referring: Monico Blitz, MD Visit Date: 02/18/2021 Occupation: @GUAROCC @  Subjective:  Left shoulder joint pain   History of Present Illness: Daryl Bell is a 71 y.o. male with history of autoimmune disease and osteoarthritis. He is taking plaquenil 200 mg 1 tablet by mouth twice daily.  He is tolerating Plaquenil without any side effects and has not missed any doses.  He denies any signs or symptoms of an autoimmune disease flare since his last office visit.  He has not had any recent rashes, oral or nasal ulcerations, swollen lymph nodes, fevers, shortness of breath, pleuritic chest pain, or Raynaud's phenomenon.  He has been experiencing increased sicca symptoms recently.  He is using Systane eyedrops and tries to drink fluids throughout the day for symptomatic relief. He presents today with increased pain in the left shoulder joint which started 2 days ago.  He states at home he was lifting a 5 pound weight with his left arm has had some discomfort since then.  This morning he was having difficulty raising his left arm.  He denies any other joint pain or joint swelling at this time.      Activities of Daily Living:  Patient reports morning stiffness for 1 hour.   Patient Reports nocturnal pain.  Difficulty dressing/grooming: Reports Difficulty climbing stairs: Denies Difficulty getting out of chair: Denies Difficulty using hands for taps, buttons, cutlery, and/or writing: Reports  Review of Systems  Constitutional:  Negative for fatigue and night sweats.  HENT:  Positive for mouth dryness and nose dryness. Negative for mouth sores.   Eyes:  Positive for dryness. Negative for pain, redness and itching.  Respiratory:  Negative for shortness of breath and difficulty breathing.   Cardiovascular:  Negative for chest pain,  palpitations, hypertension, irregular heartbeat and swelling in legs/feet.  Gastrointestinal:  Negative for blood in stool, constipation and diarrhea.  Endocrine: Negative for increased urination.  Genitourinary:  Negative for difficulty urinating and painful urination.  Musculoskeletal:  Positive for joint pain, joint pain, myalgias, morning stiffness, muscle tenderness and myalgias. Negative for joint swelling and muscle weakness.  Skin:  Negative for color change, rash, hair loss, nodules/bumps, redness, skin tightness, ulcers and sensitivity to sunlight.  Allergic/Immunologic: Negative for susceptible to infections.  Neurological:  Negative for dizziness, fainting, numbness, headaches, memory loss, night sweats and weakness.  Hematological:  Positive for bruising/bleeding tendency. Negative for swollen glands.  Psychiatric/Behavioral:  Negative for depressed mood, confusion and sleep disturbance. The patient is not nervous/anxious.    PMFS History:  Patient Active Problem List   Diagnosis Date Noted   Neck pain 03/31/2016   Acute pain of left shoulder 03/31/2016   ANA positive 12/29/2015   Ds DNA antibody positive 12/29/2015   Photosensitivity 12/29/2015   High risk medication use 12/29/2015   Other dysphagia 02/13/2013   Encounter for screening colonoscopy 02/13/2013    Past Medical History:  Diagnosis Date   Arthritis    Diabetes ([place])    GERD (gastroesophageal reflux disease)    Hypercholesterolemia    Hypertension    Kidney calculus     Family History  Problem Relation Age of Onset   Dementia Father    Colon  cancer Neg Hx    Past Surgical History:  Procedure Laterality Date   COLONOSCOPY, ESOPHAGOGASTRODUODENOSCOPY (EGD) AND ESOPHAGEAL DILATION N/A 03/07/2013   Procedure: COLONOSCOPY, ESOPHAGOGASTRODUODENOSCOPY (EGD) AND ESOPHAGEAL DILATION;  Surgeon: Daneil Dolin, MD;  [facility];  Service: Endoscopy;  Laterality: N/A;  11:15    ESOPHAGOGASTRODUODENOSCOPY     remote past in the 1990s with dilation   KIDNEY STONE SURGERY  2011   Social History   Social History Narrative   Not on file   Immunization History  Administered Date(s) Administered   Influenza, High Dose Seasonal PF 10/25/2017, 11/15/2018   Moderna Sars-Covid-2 Vaccination 03/21/2019, 04/18/2019, 12/24/2019   Pneumococcal Polysaccharide-23 11/15/2018   Zoster Recombinat (Shingrix) 06/25/2017, 10/25/2017     Objective: Vital Signs: BP 112/80 (BP Location: Right Arm, Patient Position: Sitting, Cuff Size: Large)    Pulse (!) 102    Ht 5' 7"  (1.702 m)    Wt 227 lb 9.6 oz (103.2 kg)    BMI 35.65 kg/m    Physical Exam Vitals and nursing note reviewed.  Constitutional:      Appearance: He is well-developed.  HENT:     Head: Normocephalic and atraumatic.  Eyes:     Conjunctiva/sclera: Conjunctivae normal.     Pupils: Pupils are equal, round, and reactive to light.  Cardiovascular:     Rate and Rhythm: Normal rate and regular rhythm.     Heart sounds: Normal heart sounds.  Pulmonary:     Effort: Pulmonary effort is normal.     Breath sounds: Normal breath sounds.  Abdominal:     General: Bowel sounds are normal.     Palpations: Abdomen is soft.  Musculoskeletal:     Cervical back: Normal range of motion and neck supple.  Skin:    General: Skin is warm and dry.     Capillary Refill: Capillary refill takes less than 2 seconds.  Neurological:     Mental Status: He is alert and oriented to person, place, and time.  Psychiatric:        Behavior: Behavior normal.     Musculoskeletal Exam: C-spine has good range of motion with no discomfort.  Some postural thoracic kyphosis noted.  No midline spinal tenderness noted.  Right shoulder has good range of motion with no discomfort.  Left shoulder has painful range of motion and limited abduction to about 120 degrees.  Painful limited internal rotation of the left shoulder.  Tenderness over the left  subacromial bursa.  Elbow joints, wrist joints, MCPs, PIPs, DIPs have good range of motion with no synovitis.  PIP and DIP thickening consistent with osteoarthritis of both hands.  CMC joint prominence noted bilaterally.  Hip joints have good range of motion with no groin pain.  Knee joints have good range of motion with no warmth or effusion.  Ankle joints have good range of motion with no tenderness or joint swelling.  CDAI Exam: CDAI Score: -- Patient Global: --; Provider Global: -- Swollen: --; Tender: -- Joint Exam 02/18/2021   No joint exam has been documented for this visit   There is currently no information documented on the homunculus. Go to the Rheumatology activity and complete the homunculus joint exam.  Investigation: No additional findings.  Imaging: No results found.  Recent Labs: Lab Results  Component Value Date   WBC 10.0 09/17/2020   HGB 15.3 09/17/2020   PLT 212 09/17/2020   NA 142 09/17/2020   K 4.0 09/17/2020   CL 101 09/17/2020  CO2 30 09/17/2020   GLUCOSE 105 (H) 09/17/2020   BUN 17 09/17/2020   CREATININE 1.00 09/17/2020   BILITOT 0.5 09/17/2020   ALKPHOS 46 09/01/2016   AST 21 09/17/2020   ALT 26 09/17/2020   PROT 6.3 09/17/2020   ALBUMIN 4.2 09/01/2016   CALCIUM 9.0 09/17/2020   GFRAA 93 04/16/2020    Speciality Comments: PLQ Eye Exam 11/05/2020  [place], [place]  Procedures:  Large Joint Inj: L subacromial bursa on 02/18/2021 11:15 AM Indications: pain Details: 27 G 1.5 in needle, posterior approach  Arthrogram: No  Medications: 1.5 mL lidocaine 1 %; 40 mg triamcinolone acetonide 40 MG/ML Aspirate: 0 mL Outcome: tolerated well, no immediate complications Procedure, treatment alternatives, risks and benefits explained, specific risks discussed. Consent was given by the patient. Immediately prior to procedure a time out was called to verify the correct patient, procedure, equipment, support staff and site/side marked as  required. Patient was prepped and draped in the usual sterile fashion.    Allergies: Patient has no known allergies.     Assessment / Plan:     Visit Diagnoses: Autoimmune disease ([place]) - Positive ANA, positive double-stranded DNA, history of photosensitivity: He has not had any signs or symptoms of an autoimmune disease flare.  He is clinically doing well taking Plaquenil 200 mg 1 tablet by mouth twice daily.  He continues to tolerate Plaquenil without any side effects and has not missed any doses recently.  He has not had any recent rashes, Raynaud's phenomenon, oral or nasal ulcerations, cervical lymphadenopathy, fevers, shortness of breath, pleuritic chest pain, or palpitations.  He has been experiencing sicca symptoms and uses Systane eyedrops daily and tries to keep fluids going throughout the day.  On examination he did not have any digital ulcerations or signs of gangrene.  His lungs were clear to auscultation on examination today.  No synovitis was noted on exam.  He has been experiencing increased pain in the left shoulder for the past 2 days.  X-rays of the left shoulder were obtained and the left subacromial bursa was injected with cortisone as described above.  Aftercare was discussed.  He was advised to notify us if he develops any new or worsening symptoms.  Lab work from 09/17/2020 was reviewed today in the office: Double-stranded DNA negative, ESR within normal limits, complements within normal limits, CBC within normal limits, CMP within normal limits, and trace protein noted in urine.  The following lab work will be repeated today.  He will remain on Plaquenil as prescribed.  He was advised to notify us if he develops signs or symptoms of a flare.  He will follow-up in the office in 5 months.- Plan: CBC with Differential/Platelet, COMPLETE METABOLIC PANEL WITH GFR, Protein / creatinine ratio, urine, Anti-DNA antibody, double-stranded, C3 and C4, Sedimentation rate, ANA  High risk  medication use - Plaquenil 200 mg 1 tablet by mouth twice daily.  PLQ Eye Exam 11/05/2020 [place].  CBC and CMP updated on 09/17/2020.  CBC and CMP will be updated today.  - Plan: CBC with Differential/Platelet, COMPLETE METABOLIC PANEL WITH GFR  Primary osteoarthritis of both hands: He has PIP and DIP thickening consistent with osteoarthritis of both hands.  No tenderness or inflammation was noted on examination today.  He is able to make a complete fist bilaterally.  Discussed the importance of joint protection and muscle strengthening.  Arthritis of carpometacarpal [facility]) joint of left thumb: Left CMC joint prominence and  tenderness noted.  He has had a left CMC joint injection in the past which provided significant relief for about 2 years.  He has been doing a left [facility] joint brace daily for comfort and support.  Trochanteric bursitis of both hips: He has good range of motion of both hip joints with no groin pain.  No tenderness to palpation over the trochanteric bursa bilaterally.  Chronic left shoulder pain -He presents today with increased left shoulder joint pain which started 2 days ago.  According to the patient he was at home lifting a 5 pound weight above his head when the discomfort started.  He woke up this morning and was unable to lift his left arm.  According to the patient he has had cortisone injections in the left shoulder in the past which provided significant relief.  X-rays of the left shoulder were updated today.  The left subacromial bursa was injected with cortisone today today in the office.  He tolerated the procedure well and felt better prior to leaving the office.  He was able to raise his left arm without difficulty.  He was advised to notify us if his symptoms persist or worsen.  Plan: XR Shoulder Left  Other medical conditions are listed as follows:  History of diabetes mellitus: According to the patient his blood sugar has been well controlled  recently.  He remains on metformin. Advised to monitor blood glucose closely following the cortisone injection today.  He will notify his PCP if his blood pressure goes up with a cortisone injection.  History of gastroesophageal reflux (GERD)  History of hypertension: Blood pressure is 112/80 today in the office.  Advised the patient to monitor his blood pressure closely following the cortisone injection.  History of hypercholesterolemia  Renal calcinosis  History of sleep apnea - Uses CPAP.   Orders: Orders Placed This Encounter  Procedures   Large Joint Inj   XR Shoulder Left   CBC with Differential/Platelet   COMPLETE METABOLIC PANEL WITH GFR   Protein / creatinine ratio, urine   Anti-DNA antibody, double-stranded   C3 and C4   Sedimentation rate   ANA   No orders of the defined types were placed in this encounter.     Follow-Up Instructions: Return in about 5 months (around 07/19/2021) for Autoimmune Disease, Osteoarthritis.   Ofilia Neas, PA-C  Note - This record has been created using Dragon software.  Chart creation errors have been sought, but may not always  have been located. Such creation errors do not reflect on  the standard of medical care.

## 2021-02-18 ENCOUNTER — Ambulatory Visit: Payer: Self-pay

## 2021-02-18 ENCOUNTER — Other Ambulatory Visit: Payer: Self-pay

## 2021-02-18 ENCOUNTER — Ambulatory Visit (INDEPENDENT_AMBULATORY_CARE_PROVIDER_SITE_OTHER): Payer: Medicare Other | Admitting: Physician Assistant

## 2021-02-18 ENCOUNTER — Encounter: Payer: Self-pay | Admitting: Physician Assistant

## 2021-02-18 VITALS — BP 112/80 | HR 102 | Ht 67.0 in | Wt 227.6 lb

## 2021-02-18 DIAGNOSIS — M7061 Trochanteric bursitis, right hip: Secondary | ICD-10-CM | POA: Diagnosis not present

## 2021-02-18 DIAGNOSIS — Z8719 Personal history of other diseases of the digestive system: Secondary | ICD-10-CM | POA: Diagnosis not present

## 2021-02-18 DIAGNOSIS — M19042 Primary osteoarthritis, left hand: Secondary | ICD-10-CM

## 2021-02-18 DIAGNOSIS — N29 Other disorders of kidney and ureter in diseases classified elsewhere: Secondary | ICD-10-CM

## 2021-02-18 DIAGNOSIS — Z79899 Other long term (current) drug therapy: Secondary | ICD-10-CM

## 2021-02-18 DIAGNOSIS — Z8669 Personal history of other diseases of the nervous system and sense organs: Secondary | ICD-10-CM | POA: Diagnosis not present

## 2021-02-18 DIAGNOSIS — M7062 Trochanteric bursitis, left hip: Secondary | ICD-10-CM

## 2021-02-18 DIAGNOSIS — M1812 Unilateral primary osteoarthritis of first carpometacarpal joint, left hand: Secondary | ICD-10-CM

## 2021-02-18 DIAGNOSIS — M359 Systemic involvement of connective tissue, unspecified: Secondary | ICD-10-CM

## 2021-02-18 DIAGNOSIS — Z8679 Personal history of other diseases of the circulatory system: Secondary | ICD-10-CM

## 2021-02-18 DIAGNOSIS — G8929 Other chronic pain: Secondary | ICD-10-CM | POA: Diagnosis not present

## 2021-02-18 DIAGNOSIS — Z8639 Personal history of other endocrine, nutritional and metabolic disease: Secondary | ICD-10-CM | POA: Diagnosis not present

## 2021-02-18 DIAGNOSIS — M25512 Pain in left shoulder: Secondary | ICD-10-CM

## 2021-02-18 DIAGNOSIS — M19041 Primary osteoarthritis, right hand: Secondary | ICD-10-CM

## 2021-02-18 MED ORDER — LIDOCAINE HCL 1 % IJ SOLN
1.5000 mL | INTRAMUSCULAR | Status: AC | PRN
  Administered 2021-02-18: 1.5 mL

## 2021-02-18 MED ORDER — TRIAMCINOLONE ACETONIDE 40 MG/ML IJ SUSP
40.0000 mg | INTRAMUSCULAR | Status: AC | PRN
  Administered 2021-02-18: 40 mg via INTRA_ARTICULAR

## 2021-02-18 NOTE — Patient Instructions (Signed)
Shoulder Exercises Ask your health care provider which exercises are safe for you. Do exercises exactly as told by your health care provider and adjust them as directed. It is normal to feel mild stretching, pulling, tightness, or discomfort as you do these exercises. Stop right away if you feel sudden pain or your pain gets worse. Do not begin these exercises until told by your health care provider. Stretching exercises External rotation and abduction This exercise is sometimes called corner stretch. This exercise rotates your arm outward (external rotation) and moves your arm out from your body (abduction). Stand in a doorway with one of your feet slightly in front of the other. This is called a staggered stance. If you cannot reach your forearms to the door frame, stand facing a corner of a room. Choose one of the following positions as told by your health care provider: Place your hands and forearms on the door frame above your head. Place your hands and forearms on the door frame at the height of your head. Place your hands on the door frame at the height of your elbows. Slowly move your weight onto your front foot until you feel a stretch across your chest and in the front of your shoulders. Keep your head and chest upright and keep your abdominal muscles tight. Hold for __________ seconds. To release the stretch, shift your weight to your back foot. Repeat __________ times. Complete this exercise __________ times a day. Extension, standing Stand and hold a broomstick, a cane, or a similar object behind your back. Your hands should be a little wider than shoulder width apart. Your palms should face away from your back. Keeping your elbows straight and your shoulder muscles relaxed, move the stick away from your body until you feel a stretch in your shoulders (extension). Avoid shrugging your shoulders while you move the stick. Keep your shoulder blades tucked down toward the middle of your  back. Hold for __________ seconds. Slowly return to the starting position. Repeat __________ times. Complete this exercise __________ times a day. Range-of-motion exercises Pendulum  Stand near a wall or a surface that you can hold onto for balance. Bend at the waist and let your left / right arm hang straight down. Use your other arm to support you. Keep your back straight and do not lock your knees. Relax your left / right arm and shoulder muscles, and move your hips and your trunk so your left / right arm swings freely. Your arm should swing because of the motion of your body, not because you are using your arm or shoulder muscles. Keep moving your hips and trunk so your arm swings in the following directions, as told by your health care provider: Side to side. Forward and backward. In clockwise and counterclockwise circles. Continue each motion for __________ seconds, or for as long as told by your health care provider. Slowly return to the starting position. Repeat __________ times. Complete this exercise __________ times a day. Shoulder flexion, standing  Stand and hold a broomstick, a cane, or a similar object. Place your hands a little more than shoulder width apart on the object. Your left / right hand should be palm up, and your other hand should be palm down. Keep your elbow straight and your shoulder muscles relaxed. Push the stick up with your healthy arm to raise your left / right arm in front of your body, and then over your head until you feel a stretch in your shoulder (flexion). Avoid   shrugging your shoulder while you raise your arm. Keep your shoulder blade tucked down toward the middle of your back. Hold for __________ seconds. Slowly return to the starting position. Repeat __________ times. Complete this exercise __________ times a day. Shoulder abduction, standing Stand and hold a broomstick, a cane, or a similar object. Place your hands a little more than shoulder  width apart on the object. Your left / right hand should be palm up, and your other hand should be palm down. Keep your elbow straight and your shoulder muscles relaxed. Push the object across your body toward your left / right side. Raise your left / right arm to the side of your body (abduction) until you feel a stretch in your shoulder. Do not raise your arm above shoulder height unless your health care provider tells you to do that. If directed, raise your arm over your head. Avoid shrugging your shoulder while you raise your arm. Keep your shoulder blade tucked down toward the middle of your back. Hold for __________ seconds. Slowly return to the starting position. Repeat __________ times. Complete this exercise __________ times a day. Internal rotation  Place your left / right hand behind your back, palm up. Use your other hand to dangle an exercise band, a towel, or a similar object over your shoulder. Grasp the band with your left / right hand so you are holding on to both ends. Gently pull up on the band until you feel a stretch in the front of your left / right shoulder. The movement of your arm toward the center of your body is called internal rotation. Avoid shrugging your shoulder while you raise your arm. Keep your shoulder blade tucked down toward the middle of your back. Hold for __________ seconds. Release the stretch by letting go of the band and lowering your hands. Repeat __________ times. Complete this exercise __________ times a day. Strengthening exercises External rotation  Sit in a stable chair without armrests. Secure an exercise band to a stable object at elbow height on your left / right side. Place a soft object, such as a folded towel or a small pillow, between your left / right upper arm and your body to move your elbow about 4 inches (10 cm) away from your side. Hold the end of the exercise band so it is tight and there is no slack. Keeping your elbow pressed  against the soft object, slowly move your forearm out, away from your abdomen (external rotation). Keep your body steady so only your forearm moves. Hold for __________ seconds. Slowly return to the starting position. Repeat __________ times. Complete this exercise __________ times a day. Shoulder abduction  Sit in a stable chair without armrests, or stand up. Hold a __________ weight in your left / right hand, or hold an exercise band with both hands. Start with your arms straight down and your left / right palm facing in, toward your body. Slowly lift your left / right hand out to your side (abduction). Do not lift your hand above shoulder height unless your health care provider tells you that this is safe. Keep your arms straight. Avoid shrugging your shoulder while you do this movement. Keep your shoulder blade tucked down toward the middle of your back. Hold for __________ seconds. Slowly lower your arm, and return to the starting position. Repeat __________ times. Complete this exercise __________ times a day. Shoulder extension Sit in a stable chair without armrests, or stand up. Secure an exercise band   to a stable object in front of you so it is at shoulder height. Hold one end of the exercise band in each hand. Your palms should face each other. Straighten your elbows and lift your hands up to shoulder height. Step back, away from the secured end of the exercise band, until the band is tight and there is no slack. Squeeze your shoulder blades together as you pull your hands down to the sides of your thighs (extension). Stop when your hands are straight down by your sides. Do not let your hands go behind your body. Hold for __________ seconds. Slowly return to the starting position. Repeat __________ times. Complete this exercise __________ times a day. Shoulder row Sit in a stable chair without armrests, or stand up. Secure an exercise band to a stable object in front of you so it  is at waist height. Hold one end of the exercise band in each hand. Position your palms so that your thumbs are facing the ceiling (neutral position). Bend each of your elbows to a 90-degree angle (right angle) and keep your upper arms at your sides. Step back until the band is tight and there is no slack. Slowly pull your elbows back behind you. Hold for __________ seconds. Slowly return to the starting position. Repeat __________ times. Complete this exercise __________ times a day. Shoulder press-ups  Sit in a stable chair that has armrests. Sit upright, with your feet flat on the floor. Put your hands on the armrests so your elbows are bent and your fingers are pointing forward. Your hands should be about even with the sides of your body. Push down on the armrests and use your arms to lift yourself off the chair. Straighten your elbows and lift yourself up as much as you comfortably can. Move your shoulder blades down, and avoid letting your shoulders move up toward your ears. Keep your feet on the ground. As you get stronger, your feet should support less of your body weight as you lift yourself up. Hold for __________ seconds. Slowly lower yourself back into the chair. Repeat __________ times. Complete this exercise __________ times a day. Wall push-ups  Stand so you are facing a stable wall. Your feet should be about one arm-length away from the wall. Lean forward and place your palms on the wall at shoulder height. Keep your feet flat on the floor as you bend your elbows and lean forward toward the wall. Hold for __________ seconds. Straighten your elbows to push yourself back to the starting position. Repeat __________ times. Complete this exercise __________ times a day. This information is not intended to replace advice given to you by your health care provider. Make sure you discuss any questions you have with your healthcare provider. Document Revised: 05/04/2018 Document  Reviewed: 02/09/2018 Elsevier Patient Education  2022 [employer]  

## 2021-02-19 ENCOUNTER — Telehealth: Payer: Self-pay | Admitting: Rheumatology

## 2021-02-19 NOTE — Progress Notes (Signed)
Correction: absolute monocytes are elevated.  Absolute neutrophils are WNL.

## 2021-02-19 NOTE — Progress Notes (Signed)
Complements and ESR WNL.  Protein creatinine ratio WNL.   Glucose is 114.  Rest of CMP WNL.  WBC count is slightly elevated.  Absolute neutrophils are slightly elevated. Please clarify if he has had any recent infections.

## 2021-02-19 NOTE — Telephone Encounter (Signed)
Patient called the office stating he had missed Andrea's call regarding his labs and requests a return call.

## 2021-02-19 NOTE — Progress Notes (Signed)
dsDNA is negative

## 2021-02-19 NOTE — Telephone Encounter (Signed)
See lab note for details.  

## 2021-02-21 LAB — PROTEIN / CREATININE RATIO, URINE
Creatinine, Urine: 126 mg/dL (ref 20–320)
Protein/Creat Ratio: 56 mg/g creat (ref 25–148)
Protein/Creatinine Ratio: 0.056 mg/mg creat (ref 0.025–0.148)
Total Protein, Urine: 7 mg/dL (ref 5–25)

## 2021-02-21 LAB — CBC WITH DIFFERENTIAL/PLATELET
Absolute Monocytes: 1018 cells/uL — ABNORMAL HIGH (ref 200–950)
Basophils Absolute: 105 cells/uL (ref 0–200)
Basophils Relative: 0.9 %
Eosinophils Absolute: 222 cells/uL (ref 15–500)
Eosinophils Relative: 1.9 %
HCT: 45.7 % (ref 38.5–50.0)
Hemoglobin: 15.7 g/dL (ref 13.2–17.1)
Lymphs Abs: 3440 cells/uL (ref 850–3900)
MCH: 31.5 pg (ref 27.0–33.0)
MCHC: 34.4 g/dL (ref 32.0–36.0)
MCV: 91.8 fL (ref 80.0–100.0)
MPV: 11.3 fL (ref 7.5–12.5)
Monocytes Relative: 8.7 %
Neutro Abs: 6915 cells/uL (ref 1500–7800)
Neutrophils Relative %: 59.1 %
Platelets: 229 10*3/uL (ref 140–400)
RBC: 4.98 10*6/uL (ref 4.20–5.80)
RDW: 12.8 % (ref 11.0–15.0)
Total Lymphocyte: 29.4 %
WBC: 11.7 10*3/uL — ABNORMAL HIGH (ref 3.8–10.8)

## 2021-02-21 LAB — COMPLETE METABOLIC PANEL WITH GFR
AG Ratio: 2.1 (calc) (ref 1.0–2.5)
ALT: 36 U/L (ref 9–46)
AST: 28 U/L (ref 10–35)
Albumin: 4.4 g/dL (ref 3.6–5.1)
Alkaline phosphatase (APISO): 50 U/L (ref 35–144)
BUN: 12 mg/dL (ref 7–25)
CO2: 34 mmol/L — ABNORMAL HIGH (ref 20–32)
Calcium: 9.5 mg/dL (ref 8.6–10.3)
Chloride: 99 mmol/L (ref 98–110)
Creat: 0.95 mg/dL (ref 0.70–1.35)
Globulin: 2.1 g/dL (calc) (ref 1.9–3.7)
Glucose, Bld: 114 mg/dL — ABNORMAL HIGH (ref 65–99)
Potassium: 4.1 mmol/L (ref 3.5–5.3)
Sodium: 142 mmol/L (ref 135–146)
Total Bilirubin: 0.6 mg/dL (ref 0.2–1.2)
Total Protein: 6.5 g/dL (ref 6.1–8.1)
eGFR: 87 mL/min/{1.73_m2} (ref >=60)

## 2021-02-21 LAB — SEDIMENTATION RATE: Sed Rate: 11 mm/h (ref 0–20)

## 2021-02-21 LAB — C3 AND C4
C3 Complement: 138 mg/dL (ref 82–185)
C4 Complement: 23 mg/dL (ref 15–53)

## 2021-02-21 LAB — ANTI-DNA ANTIBODY, DOUBLE-STRANDED: ds DNA Ab: 2 IU/mL

## 2021-02-21 LAB — ANA: Anti Nuclear Antibody (ANA): NEGATIVE

## 2021-02-22 DIAGNOSIS — G4733 Obstructive sleep apnea (adult) (pediatric): Secondary | ICD-10-CM | POA: Diagnosis not present

## 2021-02-22 NOTE — Progress Notes (Signed)
ANA is negative.

## 2021-02-24 DIAGNOSIS — Z299 Encounter for prophylactic measures, unspecified: Secondary | ICD-10-CM | POA: Diagnosis not present

## 2021-02-24 DIAGNOSIS — R809 Proteinuria, unspecified: Secondary | ICD-10-CM | POA: Diagnosis not present

## 2021-02-24 DIAGNOSIS — E1129 Type 2 diabetes mellitus with other diabetic kidney complication: Secondary | ICD-10-CM | POA: Diagnosis not present

## 2021-02-24 DIAGNOSIS — Z789 Other specified health status: Secondary | ICD-10-CM | POA: Diagnosis not present

## 2021-02-24 DIAGNOSIS — M329 Systemic lupus erythematosus, unspecified: Secondary | ICD-10-CM | POA: Diagnosis not present

## 2021-02-24 DIAGNOSIS — E7800 Pure hypercholesterolemia, unspecified: Secondary | ICD-10-CM | POA: Diagnosis not present

## 2021-02-25 ENCOUNTER — Other Ambulatory Visit: Payer: Self-pay | Admitting: Physician Assistant

## 2021-02-25 DIAGNOSIS — M359 Systemic involvement of connective tissue, unspecified: Secondary | ICD-10-CM

## 2021-02-25 NOTE — Telephone Encounter (Signed)
Next Visit: 07/22/2021  Last Visit: 02/18/2021  Labs: 02/18/2021 Glucose is 114.  Rest of CMP WNL.  WBC count is slightly elevated.  Absolute neutrophils are slightly elevated  Eye exam: 11/05/2020  WNL    Current Dose per office note 02/18/2021: Plaquenil 200 mg 1 tablet by mouth twice daily  DX: Autoimmune disease   Last Fill: 09/30/2020  Okay to refill Plaquenil?

## 2021-03-03 DIAGNOSIS — G4733 Obstructive sleep apnea (adult) (pediatric): Secondary | ICD-10-CM | POA: Diagnosis not present

## 2021-03-17 DIAGNOSIS — Z299 Encounter for prophylactic measures, unspecified: Secondary | ICD-10-CM | POA: Diagnosis not present

## 2021-03-17 DIAGNOSIS — Z789 Other specified health status: Secondary | ICD-10-CM | POA: Diagnosis not present

## 2021-03-17 DIAGNOSIS — E1165 Type 2 diabetes mellitus with hyperglycemia: Secondary | ICD-10-CM | POA: Diagnosis not present

## 2021-03-17 DIAGNOSIS — I10 Essential (primary) hypertension: Secondary | ICD-10-CM | POA: Diagnosis not present

## 2021-03-31 DIAGNOSIS — G4733 Obstructive sleep apnea (adult) (pediatric): Secondary | ICD-10-CM | POA: Diagnosis not present

## 2021-04-22 DIAGNOSIS — G8929 Other chronic pain: Secondary | ICD-10-CM | POA: Diagnosis not present

## 2021-04-22 DIAGNOSIS — M19071 Primary osteoarthritis, right ankle and foot: Secondary | ICD-10-CM | POA: Diagnosis not present

## 2021-04-22 DIAGNOSIS — I10 Essential (primary) hypertension: Secondary | ICD-10-CM | POA: Diagnosis not present

## 2021-04-22 DIAGNOSIS — Z299 Encounter for prophylactic measures, unspecified: Secondary | ICD-10-CM | POA: Diagnosis not present

## 2021-05-01 DIAGNOSIS — G4733 Obstructive sleep apnea (adult) (pediatric): Secondary | ICD-10-CM | POA: Diagnosis not present

## 2021-05-22 DIAGNOSIS — G4733 Obstructive sleep apnea (adult) (pediatric): Secondary | ICD-10-CM | POA: Diagnosis not present

## 2021-05-31 DIAGNOSIS — G4733 Obstructive sleep apnea (adult) (pediatric): Secondary | ICD-10-CM | POA: Diagnosis not present

## 2021-06-26 ENCOUNTER — Other Ambulatory Visit: Payer: Self-pay | Admitting: Physician Assistant

## 2021-06-26 DIAGNOSIS — M359 Systemic involvement of connective tissue, unspecified: Secondary | ICD-10-CM

## 2021-06-27 NOTE — Telephone Encounter (Signed)
Next Visit: 07/22/2021  Last Visit: 02/18/2021  Labs: 02/18/2021  Complements and ESR WNL.  Protein creatinine ratio WNL.   Glucose is 114.  Rest of CMP WNL.  WBC count is slightly elevated.  Absolute neutrophils are slightly elevated. Please clarify if he has had any recent infections.  Eye exam: 11/05/2020  [place], [place]   Current Dose per office note 02/18/2021: Plaquenil 200 mg 1 tablet by mouth twice daily.   DX: Autoimmune disease   Last Fill: 02/25/2021  Okay to refill Plaquenil?

## 2021-07-01 DIAGNOSIS — G4733 Obstructive sleep apnea (adult) (pediatric): Secondary | ICD-10-CM | POA: Diagnosis not present

## 2021-07-08 DIAGNOSIS — Z789 Other specified health status: Secondary | ICD-10-CM | POA: Diagnosis not present

## 2021-07-08 DIAGNOSIS — I10 Essential (primary) hypertension: Secondary | ICD-10-CM | POA: Diagnosis not present

## 2021-07-08 DIAGNOSIS — Z0000 Encounter for general adult medical examination without abnormal findings: Secondary | ICD-10-CM | POA: Diagnosis not present

## 2021-07-08 DIAGNOSIS — Z299 Encounter for prophylactic measures, unspecified: Secondary | ICD-10-CM | POA: Diagnosis not present

## 2021-07-08 DIAGNOSIS — E1165 Type 2 diabetes mellitus with hyperglycemia: Secondary | ICD-10-CM | POA: Diagnosis not present

## 2021-07-13 NOTE — Progress Notes (Signed)
Office Visit Note  Patient: Daryl Bell             Date of Birth: 12-15-1950           MRN: [id number]             PCP: Monico Blitz, MD Referring: Monico Blitz, MD Visit Date: 07/22/2021 Occupation: @GUAROCC @  Subjective:  Medication management  History of Present Illness: Daryl Bell is a 71 y.o. male history of autoimmune disease and osteoarthritis.  He has been taking hydroxychloroquine 200 mg twice daily without any side effects.  He has been using sunscreen and sun protection.  He notices some redness on his face.  He states he has been working out on a regular basis.  He had intentional 17 pounds weight loss over the last few months.  He has felt better with the weight loss.  He has no difficulty climbing his stairs and doing routine activities.  He has been experiencing increased fatigue recently.    Activities of Daily Living:  Patient reports morning stiffness for  a few minutes.   Patient Denies nocturnal pain.  Difficulty dressing/grooming: Denies Difficulty climbing stairs: Denies Difficulty getting out of chair: Denies Difficulty using hands for taps, buttons, cutlery, and/or writing: Denies  Review of Systems  Constitutional:  Positive for fatigue and weight loss.  HENT:  Positive for mouth dryness. Negative for mouth sores and nose dryness.   Eyes:  Positive for dryness. Negative for redness and visual disturbance.  Respiratory:  Negative for cough, shortness of breath, wheezing and difficulty breathing.   Cardiovascular:  Negative for chest pain, palpitations and swelling in legs/feet.  Gastrointestinal:  Positive for constipation and diarrhea. Negative for abdominal pain, heartburn, nausea and vomiting.  Endocrine: Negative.  Negative for increased urination.  Genitourinary: Negative.  Negative for difficulty urinating.  Musculoskeletal:  Positive for joint pain, joint pain and morning stiffness.  Skin:  Negative for color change, rash and  sensitivity to sunlight.  Allergic/Immunologic: Negative for susceptible to infections.       Denies recent infections  Neurological:  Negative for dizziness, fainting, light-headedness, headaches, paresthesias and weakness. Numbness: hands / worse in the morning gloves help. Hematological:  Positive for bruising/bleeding tendency. Negative for swollen glands.  Psychiatric/Behavioral: Negative.  Negative for depressed mood and sleep disturbance. The patient is not nervous/anxious.     PMFS History:  Patient Active Problem List   Diagnosis Date Noted   Neck pain 03/31/2016   Acute pain of left shoulder 03/31/2016   ANA positive 12/29/2015   Ds DNA antibody positive 12/29/2015   Photosensitivity 12/29/2015   High risk medication use 12/29/2015   Other dysphagia 02/13/2013   Encounter for screening colonoscopy 02/13/2013    Past Medical History:  Diagnosis Date   Arthritis    Diabetes ([place])    GERD (gastroesophageal reflux disease)    Hypercholesterolemia    Hypertension    Kidney calculus     Family History  Problem Relation Age of Onset   Dementia Father    Colon cancer Neg Hx    Past Surgical History:  Procedure Laterality Date   COLONOSCOPY, ESOPHAGOGASTRODUODENOSCOPY (EGD) AND ESOPHAGEAL DILATION N/A 03/07/2013   Procedure: COLONOSCOPY, ESOPHAGOGASTRODUODENOSCOPY (EGD) AND ESOPHAGEAL DILATION;  Surgeon: Daneil Dolin, MD;  Location: [facility];  Service: Endoscopy;  Laterality: N/A;  11:15   ESOPHAGOGASTRODUODENOSCOPY     remote past in the 1990s with dilation   [place]  2011   Social  History   Social History Narrative   Not on file   Immunization History  Administered Date(s) Administered   Influenza, High Dose Seasonal PF 10/25/2017, 11/15/2018   Moderna Sars-Covid-2 Vaccination 03/21/2019, 04/18/2019, 12/24/2019   Pneumococcal Polysaccharide-23 11/15/2018   Zoster Recombinat (Shingrix) 06/25/2017, 10/25/2017     Objective: Vital Signs: BP  112/77   Pulse 79   Ht 5' 7"  (1.702 m)   Wt 225 lb (102.1 kg)   BMI 35.24 kg/m    Physical Exam Vitals and nursing note reviewed.  Constitutional:      Appearance: He is well-developed.  HENT:     Head: Normocephalic and atraumatic.  Eyes:     Conjunctiva/sclera: Conjunctivae normal.     Pupils: Pupils are equal, round, and reactive to light.  Cardiovascular:     Rate and Rhythm: Normal rate and regular rhythm.     Heart sounds: Normal heart sounds.  Pulmonary:     Effort: Pulmonary effort is normal.     Breath sounds: Normal breath sounds.  Abdominal:     General: Bowel sounds are normal.     Palpations: Abdomen is soft.  Musculoskeletal:     Cervical back: Normal range of motion and neck supple.  Skin:    General: Skin is warm and dry.     Capillary Refill: Capillary refill takes less than 2 seconds.  Neurological:     Mental Status: He is alert and oriented to person, place, and time.  Psychiatric:        Behavior: Behavior normal.      Musculoskeletal Exam: C-spine was in good range of motion.  Shoulder joints, elbow joints, wrist joints, MCPs PIPs and DIPs with good range of motion with no synovitis.  He had bilateral PIP and DIP thickening.  He had bilateral CMC thickening.  Hip joints and knee joints with good range of motion without any warmth swelling or effusion.  There was no tenderness over ankles or MTPs.  CDAI Exam: CDAI Score: -- Patient Global: --; Provider Global: -- Swollen: 0 ; Tender: 0  Joint Exam 07/22/2021   No joint exam has been documented for this visit   There is currently no information documented on the homunculus. Go to the Rheumatology activity and complete the homunculus joint exam.  Investigation: No additional findings.  Imaging: No results found.  Recent Labs: Lab Results  Component Value Date   WBC 11.7 (H) 02/18/2021   HGB 15.7 02/18/2021   PLT 229 02/18/2021   NA 142 02/18/2021   K 4.1 02/18/2021   CL 99 02/18/2021    CO2 34 (H) 02/18/2021   GLUCOSE 114 (H) 02/18/2021   BUN 12 02/18/2021   CREATININE 0.95 02/18/2021   BILITOT 0.6 02/18/2021   ALKPHOS 46 09/01/2016   AST 28 02/18/2021   ALT 36 02/18/2021   PROT 6.5 02/18/2021   ALBUMIN 4.2 09/01/2016   CALCIUM 9.5 02/18/2021   GFRAA 93 04/16/2020     Speciality Comments: PLQ Eye Exam 11/05/2020  [place]  Procedures:  No procedures performed Allergies: Patient has no known allergies.   Assessment / Plan:     Visit Diagnoses: Autoimmune disease ([place]) - +ANA,+dsDNA, photosensitivity, sicca symptoms. -He is clinically doing well.  He gives history of dry mouth, dry eyes and photosensitivity.  He has been on hydroxychloroquine 200 mg p.o. twice daily without any side effects.  He has been using sun protection.  He had intentional weight loss.  He has been  experiencing increased fatigue.  I will obtain labs today.  I discussed possibly decreasing hydroxychloroquine to 200 mg twice daily Monday to Friday in the future if his labs stay stable.: Protein / creatinine ratio, urine, Sedimentation rate, C3 and C4, Anti-DNA antibody, double-stranded  High risk medication use - Plaquenil 200 mg p.o. twice daily.  Eye examination 10/13/ 2022 was normal.February 18, 2021 urine protein negative, C3-C4 normal, ANA neg, dsDNA neg, ESR 11 -Labs were reviewed with the patient.  We will obtain labs today.  Information regarding immunization was placed in the AVS.  Plan: CBC with Differential/Platelet, COMPLETE METABOLIC PANEL WITH GFR  Chronic left shoulder pain - X-rays obtained at the last visit were unremarkable.  Subacromial bursa was injected on February 18, 2021.  Symptoms resolved after the injection.  He had good range of motion of bilateral shoulders today.  Primary osteoarthritis of both hands-he had bilateral PIP and DIP thickening.  Joint protection was discussed.  Arthritis of carpometacarpal [facility]) joint of left thumb - He had improvement  after left CMC injection in 2021.  He has been using a brace.  Trochanteric bursitis of both hips-he has intermittent discomfort in the trochanteric bursa.  IT band stretches were discussed.  History of hypertension-blood pressure was normal today.  History of hypercholesterolemia  History of diabetes mellitus  History of gastroesophageal reflux (GERD)  Renal calcinosis  History of sleep apnea - On CPAP.  Other fatigue -has been experiencing increased fatigue.  Plan: TSH  Vitamin D deficiency -he has history of vitamin D deficiency.  He has been taking vitamin D.   he is experiencing increased fatigue I will check vitamin D level.  Plan: VITAMIN D 25 Hydroxy (Vit-D Deficiency, Fractures)  Orders: Orders Placed This Encounter  Procedures   CBC with Differential/Platelet   COMPLETE METABOLIC PANEL WITH GFR   Protein / creatinine ratio, urine   Sedimentation rate   VITAMIN D 25 Hydroxy (Vit-D Deficiency, Fractures)   C3 and C4   Anti-DNA antibody, double-stranded   TSH   No orders of the defined types were placed in this encounter.    Follow-Up Instructions: Return in about 5 months (around 12/22/2021) for Autoimmune disease.   Bo Merino, MD  Note - This record has been created using Editor, commissioning.  Chart creation errors have been sought, but may not always  have been located. Such creation errors do not reflect on  the standard of medical care.

## 2021-07-19 ENCOUNTER — Encounter: Payer: Self-pay | Admitting: *Deleted

## 2021-07-22 ENCOUNTER — Encounter: Payer: Self-pay | Admitting: Rheumatology

## 2021-07-22 ENCOUNTER — Ambulatory Visit (INDEPENDENT_AMBULATORY_CARE_PROVIDER_SITE_OTHER): Payer: Medicare Other | Admitting: Rheumatology

## 2021-07-22 VITALS — BP 112/77 | HR 79 | Ht 67.0 in | Wt 225.0 lb

## 2021-07-22 DIAGNOSIS — R5383 Other fatigue: Secondary | ICD-10-CM

## 2021-07-22 DIAGNOSIS — Z79899 Other long term (current) drug therapy: Secondary | ICD-10-CM

## 2021-07-22 DIAGNOSIS — M25512 Pain in left shoulder: Secondary | ICD-10-CM

## 2021-07-22 DIAGNOSIS — M19041 Primary osteoarthritis, right hand: Secondary | ICD-10-CM

## 2021-07-22 DIAGNOSIS — M7062 Trochanteric bursitis, left hip: Secondary | ICD-10-CM

## 2021-07-22 DIAGNOSIS — E559 Vitamin D deficiency, unspecified: Secondary | ICD-10-CM | POA: Diagnosis not present

## 2021-07-22 DIAGNOSIS — Z8679 Personal history of other diseases of the circulatory system: Secondary | ICD-10-CM | POA: Diagnosis not present

## 2021-07-22 DIAGNOSIS — Z8669 Personal history of other diseases of the nervous system and sense organs: Secondary | ICD-10-CM | POA: Diagnosis not present

## 2021-07-22 DIAGNOSIS — Z8719 Personal history of other diseases of the digestive system: Secondary | ICD-10-CM

## 2021-07-22 DIAGNOSIS — M7061 Trochanteric bursitis, right hip: Secondary | ICD-10-CM | POA: Diagnosis not present

## 2021-07-22 DIAGNOSIS — Z8639 Personal history of other endocrine, nutritional and metabolic disease: Secondary | ICD-10-CM

## 2021-07-22 DIAGNOSIS — N29 Other disorders of kidney and ureter in diseases classified elsewhere: Secondary | ICD-10-CM

## 2021-07-22 DIAGNOSIS — G8929 Other chronic pain: Secondary | ICD-10-CM

## 2021-07-22 DIAGNOSIS — M19042 Primary osteoarthritis, left hand: Secondary | ICD-10-CM

## 2021-07-22 DIAGNOSIS — M359 Systemic involvement of connective tissue, unspecified: Secondary | ICD-10-CM

## 2021-07-22 DIAGNOSIS — M1812 Unilateral primary osteoarthritis of first carpometacarpal joint, left hand: Secondary | ICD-10-CM

## 2021-07-22 NOTE — Patient Instructions (Signed)
Vaccines You are taking a medication(s) that can suppress your immune system.  The following immunizations are recommended: Flu annually Covid-19  Td/Tdap (tetanus, diphtheria, pertussis) every 10 years Pneumonia (Prevnar 15 then Pneumovax 23 at least 1 year apart.  Alternatively, can take Prevnar 20 without needing additional dose) Shingrix: 2 doses from 4 weeks to 6 months apart  Please check with your PCP to make sure you are up to date.  

## 2021-07-23 LAB — COMPLETE METABOLIC PANEL WITH GFR
AG Ratio: 2 (calc) (ref 1.0–2.5)
ALT: 22 U/L (ref 9–46)
AST: 18 U/L (ref 10–35)
Albumin: 4.2 g/dL (ref 3.6–5.1)
Alkaline phosphatase (APISO): 47 U/L (ref 35–144)
BUN: 13 mg/dL (ref 7–25)
CO2: 32 mmol/L (ref 20–32)
Calcium: 9.4 mg/dL (ref 8.6–10.3)
Chloride: 100 mmol/L (ref 98–110)
Creat: 1.07 mg/dL (ref 0.70–1.35)
Globulin: 2.1 g/dL (calc) (ref 1.9–3.7)
Glucose, Bld: 125 mg/dL — ABNORMAL HIGH (ref 65–99)
Potassium: 4.2 mmol/L (ref 3.5–5.3)
Sodium: 141 mmol/L (ref 135–146)
Total Bilirubin: 0.4 mg/dL (ref 0.2–1.2)
Total Protein: 6.3 g/dL (ref 6.1–8.1)
eGFR: 76 mL/min/{1.73_m2} (ref >=60)

## 2021-07-23 LAB — C3 AND C4
C3 Complement: 134 mg/dL (ref 82–185)
C4 Complement: 27 mg/dL (ref 15–53)

## 2021-07-23 LAB — SEDIMENTATION RATE: Sed Rate: 2 mm/h (ref 0–20)

## 2021-07-23 LAB — CBC WITH DIFFERENTIAL/PLATELET
Absolute Monocytes: 772 cells/uL (ref 200–950)
Basophils Absolute: 109 cells/uL (ref 0–200)
Basophils Relative: 1.1 %
Eosinophils Absolute: 158 cells/uL (ref 15–500)
Eosinophils Relative: 1.6 %
HCT: 44.9 % (ref 38.5–50.0)
Hemoglobin: 15.6 g/dL (ref 13.2–17.1)
Lymphs Abs: 3158 cells/uL (ref 850–3900)
MCH: 31.5 pg (ref 27.0–33.0)
MCHC: 34.7 g/dL (ref 32.0–36.0)
MCV: 90.5 fL (ref 80.0–100.0)
MPV: 11.2 fL (ref 7.5–12.5)
Monocytes Relative: 7.8 %
Neutro Abs: 5702 cells/uL (ref 1500–7800)
Neutrophils Relative %: 57.6 %
Platelets: 220 10*3/uL (ref 140–400)
RBC: 4.96 10*6/uL (ref 4.20–5.80)
RDW: 11.8 % (ref 11.0–15.0)
Total Lymphocyte: 31.9 %
WBC: 9.9 10*3/uL (ref 3.8–10.8)

## 2021-07-23 LAB — VITAMIN D 25 HYDROXY (VIT D DEFICIENCY, FRACTURES): Vit D, 25-Hydroxy: 71 ng/mL (ref 30–100)

## 2021-07-23 LAB — PROTEIN / CREATININE RATIO, URINE
Creatinine, Urine: 146 mg/dL (ref 20–320)
Protein/Creat Ratio: 68 mg/g creat (ref 25–148)
Protein/Creatinine Ratio: 0.068 mg/mg creat (ref 0.025–0.148)
Total Protein, Urine: 10 mg/dL (ref 5–25)

## 2021-07-23 LAB — ANTI-DNA ANTIBODY, DOUBLE-STRANDED: ds DNA Ab: 3 IU/mL

## 2021-07-23 LAB — TSH: TSH: 1.95 mIU/L (ref 0.40–4.50)

## 2021-07-26 ENCOUNTER — Other Ambulatory Visit: Payer: Self-pay | Admitting: Physician Assistant

## 2021-07-26 DIAGNOSIS — M359 Systemic involvement of connective tissue, unspecified: Secondary | ICD-10-CM

## 2021-07-31 DIAGNOSIS — G4733 Obstructive sleep apnea (adult) (pediatric): Secondary | ICD-10-CM | POA: Diagnosis not present

## 2021-08-13 DIAGNOSIS — G4733 Obstructive sleep apnea (adult) (pediatric): Secondary | ICD-10-CM | POA: Diagnosis not present

## 2021-08-31 DIAGNOSIS — G4733 Obstructive sleep apnea (adult) (pediatric): Secondary | ICD-10-CM | POA: Diagnosis not present

## 2021-10-11 ENCOUNTER — Other Ambulatory Visit: Payer: Self-pay | Admitting: Physician Assistant

## 2021-10-11 ENCOUNTER — Other Ambulatory Visit: Payer: Self-pay | Admitting: *Deleted

## 2021-10-11 DIAGNOSIS — M359 Systemic involvement of connective tissue, unspecified: Secondary | ICD-10-CM

## 2021-10-11 MED ORDER — HYDROXYCHLOROQUINE SULFATE 200 MG PO TABS: 200.0000 mg | ORAL_TABLET | Freq: Two times a day (BID) | ORAL | 0 refills | Status: DC

## 2021-10-11 NOTE — Telephone Encounter (Signed)
Next Visit: 12/30/2021  Last Visit: 07/22/2021  Labs: 07/22/2021 CBC WNL.  Glucose is 125. Rest of CMP WNL  Eye exam: 11/05/2020  WNL    Current Dose per office note 07/22/2021: Plaquenil 200 mg p.o. twice daily  AX:[id number] disease  Last Fill: 06/28/2021  Okay to refill Plaquenil?

## 2021-10-29 DIAGNOSIS — M25512 Pain in left shoulder: Secondary | ICD-10-CM | POA: Diagnosis not present

## 2021-10-29 DIAGNOSIS — Z7189 Other specified counseling: Secondary | ICD-10-CM | POA: Diagnosis not present

## 2021-10-29 DIAGNOSIS — I10 Essential (primary) hypertension: Secondary | ICD-10-CM | POA: Diagnosis not present

## 2021-10-29 DIAGNOSIS — Z0000 Encounter for general adult medical examination without abnormal findings: Secondary | ICD-10-CM | POA: Diagnosis not present

## 2021-10-29 DIAGNOSIS — E7800 Pure hypercholesterolemia, unspecified: Secondary | ICD-10-CM | POA: Diagnosis not present

## 2021-10-29 DIAGNOSIS — R5383 Other fatigue: Secondary | ICD-10-CM | POA: Diagnosis not present

## 2021-10-29 DIAGNOSIS — Z299 Encounter for prophylactic measures, unspecified: Secondary | ICD-10-CM | POA: Diagnosis not present

## 2021-10-29 DIAGNOSIS — G8929 Other chronic pain: Secondary | ICD-10-CM | POA: Diagnosis not present

## 2021-10-29 DIAGNOSIS — Z79899 Other long term (current) drug therapy: Secondary | ICD-10-CM | POA: Diagnosis not present

## 2021-10-31 DIAGNOSIS — G4733 Obstructive sleep apnea (adult) (pediatric): Secondary | ICD-10-CM | POA: Diagnosis not present

## 2021-11-05 DIAGNOSIS — H40013 Open angle with borderline findings, low risk, bilateral: Secondary | ICD-10-CM | POA: Diagnosis not present

## 2021-11-11 DIAGNOSIS — G4733 Obstructive sleep apnea (adult) (pediatric): Secondary | ICD-10-CM | POA: Diagnosis not present

## 2021-11-11 DIAGNOSIS — Z79899 Other long term (current) drug therapy: Secondary | ICD-10-CM | POA: Diagnosis not present

## 2021-11-11 LAB — HM DIABETES EYE EXAM

## 2021-12-20 NOTE — Progress Notes (Unsigned)
Office Visit Note  Patient: Daryl Bell             Date of Birth: Jun 24, 1950           MRN: [id number]             PCP: Monico Blitz, MD Referring: Monico Blitz, MD Visit Date: 12/30/2021 Occupation: _0 @  Subjective:  Medication monitoring   History of Present Illness: Daryl Bell is a 71 y.o. male with history of autoimmune disease and osteoarthritis.  He is taking plaquenil 200 mg 1 tablet by mouth twice daily.  He continues to tolerate Plaquenil without any side effects and has not missed any doses recently.  He denies any signs or symptoms of an autoimmune disease flare.  He reports that he has been able to increase his activity level and has been walking 3 days a week at the gym and has also been using hand weights.  He has noticed intentional weight loss as well as improvement in his energy level.  He continues to experience intermittent discomfort in the left [facility] joint but has been wearing a left CMC joint brace which has been helpful.  He denies any joint swelling at this time.  He has been taking natural anti-inflammatories as previously discussed as well as Osteo Bi-Flex on a daily basis which she finds to be helpful.  He denies any recent rashes, Raynaud's phenomenon, swollen lymph nodes, sores in his mouth or nose, or shortness of breath.  He continues to have chronic sicca symptoms which have been tolerable overall.  He denies any new questions or concerns.    Activities of Daily Living:  Patient reports morning stiffness for 20 minutes.   Patient Denies nocturnal pain.  Difficulty dressing/grooming: Denies Difficulty climbing stairs: Denies Difficulty getting out of chair: Denies Difficulty using hands for taps, buttons, cutlery, and/or writing: Denies  Review of Systems  Constitutional:  Negative for fatigue.  HENT:  Positive for mouth dryness. Negative for mouth sores.   Eyes:  Positive for dryness.  Respiratory:  Negative for shortness of breath.    Cardiovascular:  Negative for chest pain and palpitations.  Gastrointestinal:  Positive for constipation. Negative for blood in stool and diarrhea.  Endocrine: Negative for increased urination.  Genitourinary:  Negative for involuntary urination.  Musculoskeletal:  Positive for joint pain, gait problem, joint pain, muscle weakness and morning stiffness. Negative for joint swelling, myalgias, muscle tenderness and myalgias.  Skin:  Positive for color change and sensitivity to sunlight. Negative for rash and hair loss.  Allergic/Immunologic: Negative for susceptible to infections.  Neurological:  Positive for dizziness and headaches.  Hematological:  Negative for swollen glands.  Psychiatric/Behavioral:  Negative for depressed mood and sleep disturbance. The patient is not nervous/anxious.     PMFS History:  Patient Active Problem List   Diagnosis Date Noted   Neck pain 03/31/2016   Acute pain of left shoulder 03/31/2016   ANA positive 12/29/2015   Ds DNA antibody positive 12/29/2015   Photosensitivity 12/29/2015   High risk medication use 12/29/2015   Other dysphagia 02/13/2013   Encounter for screening colonoscopy 02/13/2013    Past Medical History:  Diagnosis Date   Arthritis    Diabetes ([place])    GERD (gastroesophageal reflux disease)    Hypercholesterolemia    Hypertension    Kidney calculus     Family History  Problem Relation Age of Onset   Dementia Father    Colon cancer Neg Hx  Past Surgical History:  Procedure Laterality Date   COLONOSCOPY, ESOPHAGOGASTRODUODENOSCOPY (EGD) AND ESOPHAGEAL DILATION N/A 03/07/2013   Procedure: COLONOSCOPY, ESOPHAGOGASTRODUODENOSCOPY (EGD) AND ESOPHAGEAL DILATION;  Surgeon: Daneil Dolin, MD;  [facility];  Service: Endoscopy;  Laterality: N/A;  11:15   ESOPHAGOGASTRODUODENOSCOPY     remote past in the 1990s with dilation   KIDNEY STONE SURGERY  2011   Social History   Social History Narrative   Not on file    Immunization History  Administered Date(s) Administered   Influenza, High Dose Seasonal PF 10/25/2017, 11/15/2018   Moderna Sars-Covid-2 Vaccination 03/21/2019, 04/18/2019, 12/24/2019   Pneumococcal Polysaccharide-23 11/15/2018   Zoster Recombinat (Shingrix) 06/25/2017, 10/25/2017     Objective: Vital Signs: BP 121/79 (BP Location: Left Arm, Patient Position: Sitting, Cuff Size: Normal)   Pulse 91   Resp 16   Ht _0  (1.702 m)   Wt 205 lb (93 kg)   BMI 32.11 kg/m    Physical Exam Vitals and nursing note reviewed.  Constitutional:      Appearance: He is well-developed.  HENT:     Head: Normocephalic and atraumatic.  Eyes:     Conjunctiva/sclera: Conjunctivae normal.     Pupils: Pupils are equal, round, and reactive to light.  Cardiovascular:     Rate and Rhythm: Normal rate and regular rhythm.     Heart sounds: Normal heart sounds.  Pulmonary:     Effort: Pulmonary effort is normal.     Breath sounds: Normal breath sounds.  Abdominal:     General: Bowel sounds are normal.     Palpations: Abdomen is soft.  Musculoskeletal:     Cervical back: Normal range of motion and neck supple.  Skin:    General: Skin is warm and dry.     Capillary Refill: Capillary refill takes less than 2 seconds.  Neurological:     Mental Status: He is alert and oriented to person, place, and time.  Psychiatric:        Behavior: Behavior normal.      Musculoskeletal Exam: C-spine, thoracic spine, lumbar spine have good range of motion.  Shoulder joints, elbow joints, wrist joints, MCPs, PIPs, DIPs have good range of motion with no synovitis.  Some tenderness and prominence over the left [facility] joint.  PIP and DIP thickening consistent with osteoarthritis of both hands.  Complete fist formation bilaterally.  Hip joints have good range of motion with no groin pain.  Tenderness over bilateral trochanteric bursa.  Knee joints have good range of motion with no warmth or effusion.  Ankle joints have  good range of motion with no tenderness or joint swelling.  CDAI Exam: CDAI Score: -- Patient Global: --; Provider Global: -- Swollen: --; Tender: -- Joint Exam 12/30/2021   No joint exam has been documented for this visit   There is currently no information documented on the homunculus. Go to the Rheumatology activity and complete the homunculus joint exam.  Investigation: No additional findings.  Imaging: No results found.  Recent Labs: Lab Results  Component Value Date   WBC 9.9 07/22/2021   HGB 15.6 07/22/2021   PLT 220 07/22/2021   NA 141 07/22/2021   K 4.2 07/22/2021   CL 100 07/22/2021   CO2 32 07/22/2021   GLUCOSE 125 (H) 07/22/2021   BUN 13 07/22/2021   CREATININE 1.07 07/22/2021   BILITOT 0.4 07/22/2021   ALKPHOS 46 09/01/2016   AST 18 07/22/2021   ALT 22 07/22/2021   PROT  6.3 07/22/2021   ALBUMIN 4.2 09/01/2016   CALCIUM 9.4 07/22/2021   GFRAA 93 04/16/2020    Speciality Comments: PLQ Eye Exam 11/11/2021  WNL Dr. Leticia Clas, [place] f/u 1 year  Procedures:  No procedures performed Allergies: Patient has no known allergies.    Assessment / Plan:     Visit Diagnoses: Autoimmune disease ([place]) - +ANA,+dsDNA, photosensitivity, sicca symptoms:  He has not had any signs or symptoms of an autoimmune disease flare.  He has clinically been doing well taking Plaquenil 200 mg 1 tablet by mouth twice daily.  He continues to tolerate Plaquenil without any side effects and has not missed any doses recently.  He has no synovitis on examination today.  He has not been experiencing any shortness of breath, pleuritic chest pain, or palpitations.  He has increased his exercise regimen and has been walking 3 days a week at the gym and also using hand weights.  His energy level has improved.  He is also been working on intentional weight loss.  According to the patient he feels better than he has in over 6 to 7 years.  He has not had any symptoms of Raynaud's phenomenon,  recent rashes, or oral or nasal ulcerations.  He continues to have chronic sicca symptoms which have been tolerable. Lab work from 07/22/21 was reviewed today in the office: dsDNA negative, ESR WNL, complements WNL, TSH WNL, vitamin D WNL, and protein creatinine ratio WNL. The following lab work will be updated today.  He will remain on Plaquenil as prescribed.  He was advised to notify us if he develops signs or symptoms of a flare.  He will follow-up in the office in 5 months or sooner if needed. - Plan: CBC with Differential/Platelet, COMPLETE METABOLIC PANEL WITH GFR, Protein / creatinine ratio, urine, ANA, Anti-DNA antibody, double-stranded, C3 and C4, Sedimentation rate, hydroxychloroquine (PLAQUENIL) 200 MG tablet  High risk medication use - Plaquenil 200 mg 1 tablet by mouth twice daily.  PLQ Eye Exam 11/11/2021 WNL Dr. Leticia Clas, [place] f/u 1 year.  CBC and CMP were drawn on 07/22/2021.  Orders for CBC and CMP were released today. - Plan: CBC with Differential/Platelet, COMPLETE METABOLIC PANEL WITH GFR  Chronic left shoulder pain - X-rays obtained were unremarkable.  Subacromial bursa was injected on February 18, 2021.  Doing well.  Good range of motion with no discomfort.  Primary osteoarthritis of both hands: He has PIP and DIP thickening consistent with osteoarthritis of both hands.  Tenderness and prominence of the left CMC joint noted.  Discussed the importance of joint protection and muscle strengthening.  No synovitis noted.  Arthritis of carpometacarpal [facility]) joint of left thumb - He had improvement after left CMC injection in 2021.  He wears a left CMC joint brace which provides support and alleviates his discomfort.  He does not plan on proceeding with a left CMC joint replacement in the future.  Trochanteric bursitis of both hips: He continues to have some tenderness palpation over bilateral trochanteric bursa.  He has been performing stretching exercises daily which alleviates  his symptoms.  Other medical conditions are listed as follows:  History of diabetes mellitus  History of hypercholesterolemia  History of hypertension: Blood pressure was 121/79 today in the office.  History of gastroesophageal reflux (GERD)  Renal calcinosis  History of sleep apnea - On CPAP.  Vitamin D deficiency: D was 29 on 07/22/2021.  Other fatigue: His energy level has improved since increasing his exercise regimen.  Orders: Orders Placed This Encounter  Procedures   CBC with Differential/Platelet   COMPLETE METABOLIC PANEL WITH GFR   Protein / creatinine ratio, urine   ANA   Anti-DNA antibody, double-stranded   C3 and C4   Sedimentation rate   Meds ordered this encounter  Medications   hydroxychloroquine (PLAQUENIL) 200 MG tablet    Sig: Take 1 tablet (200 mg total) by mouth 2 (two) times daily.    Dispense:  180 tablet    Refill:  0     Follow-Up Instructions: Return in about 5 months (around 05/31/2022) for Autoimmune Disease.   Ofilia Neas, PA-C  Note - This record has been created using Dragon software.  Chart creation errors have been sought, but may not always  have been located. Such creation errors do not reflect on  the standard of medical care.

## 2021-12-28 ENCOUNTER — Encounter: Payer: Self-pay | Admitting: *Deleted

## 2021-12-30 ENCOUNTER — Ambulatory Visit: Payer: Medicare Other | Attending: Physician Assistant | Admitting: Physician Assistant

## 2021-12-30 ENCOUNTER — Encounter: Payer: Self-pay | Admitting: Physician Assistant

## 2021-12-30 VITALS — BP 121/79 | HR 91 | Resp 16 | Ht 67.0 in | Wt 205.0 lb

## 2021-12-30 DIAGNOSIS — Z8719 Personal history of other diseases of the digestive system: Secondary | ICD-10-CM | POA: Diagnosis not present

## 2021-12-30 DIAGNOSIS — R5383 Other fatigue: Secondary | ICD-10-CM

## 2021-12-30 DIAGNOSIS — Z8669 Personal history of other diseases of the nervous system and sense organs: Secondary | ICD-10-CM | POA: Diagnosis not present

## 2021-12-30 DIAGNOSIS — E559 Vitamin D deficiency, unspecified: Secondary | ICD-10-CM | POA: Diagnosis not present

## 2021-12-30 DIAGNOSIS — Z79899 Other long term (current) drug therapy: Secondary | ICD-10-CM | POA: Diagnosis not present

## 2021-12-30 DIAGNOSIS — N29 Other disorders of kidney and ureter in diseases classified elsewhere: Secondary | ICD-10-CM

## 2021-12-30 DIAGNOSIS — G8929 Other chronic pain: Secondary | ICD-10-CM

## 2021-12-30 DIAGNOSIS — Z8639 Personal history of other endocrine, nutritional and metabolic disease: Secondary | ICD-10-CM

## 2021-12-30 DIAGNOSIS — M7061 Trochanteric bursitis, right hip: Secondary | ICD-10-CM | POA: Diagnosis not present

## 2021-12-30 DIAGNOSIS — Z8679 Personal history of other diseases of the circulatory system: Secondary | ICD-10-CM

## 2021-12-30 DIAGNOSIS — M359 Systemic involvement of connective tissue, unspecified: Secondary | ICD-10-CM

## 2021-12-30 DIAGNOSIS — M1812 Unilateral primary osteoarthritis of first carpometacarpal joint, left hand: Secondary | ICD-10-CM | POA: Diagnosis not present

## 2021-12-30 DIAGNOSIS — M19042 Primary osteoarthritis, left hand: Secondary | ICD-10-CM

## 2021-12-30 DIAGNOSIS — M25512 Pain in left shoulder: Secondary | ICD-10-CM

## 2021-12-30 DIAGNOSIS — M19041 Primary osteoarthritis, right hand: Secondary | ICD-10-CM

## 2021-12-30 DIAGNOSIS — M7062 Trochanteric bursitis, left hip: Secondary | ICD-10-CM

## 2021-12-30 MED ORDER — HYDROXYCHLOROQUINE SULFATE 200 MG PO TABS: 200.0000 mg | ORAL_TABLET | Freq: Two times a day (BID) | ORAL | 0 refills | Status: DC

## 2021-12-31 LAB — CBC WITH DIFFERENTIAL/PLATELET
Absolute Monocytes: 814 cells/uL (ref 200–950)
Basophils Absolute: 124 cells/uL (ref 0–200)
Basophils Relative: 1.2 %
Eosinophils Absolute: 309 cells/uL (ref 15–500)
Eosinophils Relative: 3 %
HCT: 45.2 % (ref 38.5–50.0)
Hemoglobin: 15.6 g/dL (ref 13.2–17.1)
Lymphs Abs: 3904 cells/uL — ABNORMAL HIGH (ref 850–3900)
MCH: 31 pg (ref 27.0–33.0)
MCHC: 34.5 g/dL (ref 32.0–36.0)
MCV: 89.7 fL (ref 80.0–100.0)
MPV: 10.9 fL (ref 7.5–12.5)
Monocytes Relative: 7.9 %
Neutro Abs: 5150 cells/uL (ref 1500–7800)
Neutrophils Relative %: 50 %
Platelets: 221 10*3/uL (ref 140–400)
RBC: 5.04 10*6/uL (ref 4.20–5.80)
RDW: 11.9 % (ref 11.0–15.0)
Total Lymphocyte: 37.9 %
WBC: 10.3 10*3/uL (ref 3.8–10.8)

## 2021-12-31 LAB — C3 AND C4
C3 Complement: 135 mg/dL (ref 82–185)
C4 Complement: 24 mg/dL (ref 15–53)

## 2021-12-31 LAB — PROTEIN / CREATININE RATIO, URINE
Creatinine, Urine: 166 mg/dL (ref 20–320)
Protein/Creat Ratio: 48 mg/g creat (ref 25–148)
Protein/Creatinine Ratio: 0.048 mg/mg creat (ref 0.025–0.148)
Total Protein, Urine: 8 mg/dL (ref 5–25)

## 2021-12-31 LAB — COMPLETE METABOLIC PANEL WITH GFR
AG Ratio: 1.9 (calc) (ref 1.0–2.5)
ALT: 24 U/L (ref 9–46)
AST: 20 U/L (ref 10–35)
Albumin: 4.4 g/dL (ref 3.6–5.1)
Alkaline phosphatase (APISO): 50 U/L (ref 35–144)
BUN: 14 mg/dL (ref 7–25)
CO2: 31 mmol/L (ref 20–32)
Calcium: 9.4 mg/dL (ref 8.6–10.3)
Chloride: 101 mmol/L (ref 98–110)
Creat: 0.9 mg/dL (ref 0.70–1.35)
Globulin: 2.3 g/dL (calc) (ref 1.9–3.7)
Glucose, Bld: 136 mg/dL — ABNORMAL HIGH (ref 65–99)
Potassium: 4.3 mmol/L (ref 3.5–5.3)
Sodium: 142 mmol/L (ref 135–146)
Total Bilirubin: 0.5 mg/dL (ref 0.2–1.2)
Total Protein: 6.7 g/dL (ref 6.1–8.1)
eGFR: 92 mL/min/{1.73_m2} (ref >=60)

## 2021-12-31 LAB — ANTI-DNA ANTIBODY, DOUBLE-STRANDED: ds DNA Ab: 2 IU/mL

## 2021-12-31 LAB — SEDIMENTATION RATE: Sed Rate: 6 mm/h (ref 0–20)

## 2021-12-31 LAB — ANA: Anti Nuclear Antibody (ANA): NEGATIVE

## 2021-12-31 NOTE — Progress Notes (Signed)
Absolute lymphocyte count is borderline elevated. Rest of CBC WNL. Glucose is 136. Rest of CMP WNL.   ESR and complements WNL.   Protein creatinine ratio WNL.

## 2022-01-03 NOTE — Progress Notes (Signed)
ANA and dsDNA negative

## 2022-01-14 DIAGNOSIS — I10 Essential (primary) hypertension: Secondary | ICD-10-CM | POA: Diagnosis not present

## 2022-01-14 DIAGNOSIS — E1165 Type 2 diabetes mellitus with hyperglycemia: Secondary | ICD-10-CM | POA: Diagnosis not present

## 2022-01-14 DIAGNOSIS — Z299 Encounter for prophylactic measures, unspecified: Secondary | ICD-10-CM | POA: Diagnosis not present

## 2022-01-14 DIAGNOSIS — M329 Systemic lupus erythematosus, unspecified: Secondary | ICD-10-CM | POA: Diagnosis not present

## 2022-03-04 DIAGNOSIS — G4733 Obstructive sleep apnea (adult) (pediatric): Secondary | ICD-10-CM | POA: Diagnosis not present

## 2022-03-30 DIAGNOSIS — M329 Systemic lupus erythematosus, unspecified: Secondary | ICD-10-CM | POA: Diagnosis not present

## 2022-03-30 DIAGNOSIS — J069 Acute upper respiratory infection, unspecified: Secondary | ICD-10-CM | POA: Diagnosis not present

## 2022-03-30 DIAGNOSIS — E1165 Type 2 diabetes mellitus with hyperglycemia: Secondary | ICD-10-CM | POA: Diagnosis not present

## 2022-04-15 DIAGNOSIS — J309 Allergic rhinitis, unspecified: Secondary | ICD-10-CM | POA: Diagnosis not present

## 2022-04-15 DIAGNOSIS — G8929 Other chronic pain: Secondary | ICD-10-CM | POA: Diagnosis not present

## 2022-04-15 DIAGNOSIS — I10 Essential (primary) hypertension: Secondary | ICD-10-CM | POA: Diagnosis not present

## 2022-04-21 DIAGNOSIS — E1165 Type 2 diabetes mellitus with hyperglycemia: Secondary | ICD-10-CM | POA: Diagnosis not present

## 2022-04-21 DIAGNOSIS — Z299 Encounter for prophylactic measures, unspecified: Secondary | ICD-10-CM | POA: Diagnosis not present

## 2022-04-21 DIAGNOSIS — I10 Essential (primary) hypertension: Secondary | ICD-10-CM | POA: Diagnosis not present

## 2022-04-29 ENCOUNTER — Other Ambulatory Visit: Payer: Self-pay | Admitting: Physician Assistant

## 2022-04-29 DIAGNOSIS — M359 Systemic involvement of connective tissue, unspecified: Secondary | ICD-10-CM

## 2022-04-29 NOTE — Telephone Encounter (Signed)
Last Fill: 12/30/2021  Eye exam: 11/11/2021 WNL   Labs: 12/30/2021 Absolute lymphocyte count is borderline elevated. Rest of CBC WNL. Glucose is 136. Rest of CMP WNL.     Next Visit: 06/02/2022  Last Visit: 12/30/2021  [id number] disease   Current Dose per office note 12/30/2021:  Plaquenil 200 mg 1 tablet by mouth twice daily.   Okay to refill Plaquenil?

## 2022-05-09 DIAGNOSIS — Z7189 Other specified counseling: Secondary | ICD-10-CM | POA: Diagnosis not present

## 2022-05-09 DIAGNOSIS — Z0000 Encounter for general adult medical examination without abnormal findings: Secondary | ICD-10-CM | POA: Diagnosis not present

## 2022-05-09 DIAGNOSIS — M329 Systemic lupus erythematosus, unspecified: Secondary | ICD-10-CM | POA: Diagnosis not present

## 2022-05-09 DIAGNOSIS — I10 Essential (primary) hypertension: Secondary | ICD-10-CM | POA: Diagnosis not present

## 2022-05-09 DIAGNOSIS — Z299 Encounter for prophylactic measures, unspecified: Secondary | ICD-10-CM | POA: Diagnosis not present

## 2022-05-10 DIAGNOSIS — E1129 Type 2 diabetes mellitus with other diabetic kidney complication: Secondary | ICD-10-CM | POA: Diagnosis not present

## 2022-05-10 DIAGNOSIS — Z1211 Encounter for screening for malignant neoplasm of colon: Secondary | ICD-10-CM | POA: Diagnosis not present

## 2022-05-19 NOTE — Progress Notes (Unsigned)
Office Visit Note  Patient: Daryl Bell             Date of Birth: 1950/11/23           MRN: [id number]             PCP: Kirstie Peri, MD Referring: Kirstie Peri, MD Visit Date: 06/02/2022 Occupation: @GUAROCC @  Subjective:  Sicca symptoms   History of Present Illness: Daryl Bell is a 72 y.o. male with history of autoimmune disease.  Patient is taking plaquenil 200 mg 1 tablet by mouth twice daily.  He is tolerating Plaquenil without any side effects and has not missed any doses recently.  He denies any signs or symptoms of a flare.  He continues to have chronic sicca symptoms.  He has been using Biotene products for mouth dryness as well as Systane and Pataday drops for dry eyes.  He denies any sores in his mouth or nose.  He continues to have photosensitivity and tries to wear sun protective gear when he plans on being outdoors.  He denies any symptoms of Raynaud's phenomenon.  He denies any swollen lymph nodes.  Patient continues to experience intermittent pain and stiffness in both hands due to underlying osteoarthritis.  He remains on ginger, turmeric, tart cherry, and fish oil.  He takes tramadol or ibuprofen as needed for pain relief. Patient states that he had a Cologuard performed which was unremarkable.  He will be having a repeat Cologuard in 3 years.   Activities of Daily Living:  Patient reports morning stiffness for 1 hour.   Patient Denies nocturnal pain.  Difficulty dressing/grooming: Denies Difficulty climbing stairs: Denies Difficulty getting out of chair: Denies Difficulty using hands for taps, buttons, cutlery, and/or writing: Denies  Review of Systems  Constitutional:  Negative for fatigue.  HENT:  Positive for mouth dryness. Negative for mouth sores.   Eyes:  Positive for dryness.  Respiratory:  Negative for shortness of breath.   Cardiovascular:  Negative for chest pain and palpitations.  Gastrointestinal:  Positive for constipation and  diarrhea. Negative for blood in stool.  Endocrine: Negative for increased urination.  Genitourinary:  Negative for involuntary urination.  Musculoskeletal:  Positive for joint pain, joint pain, joint swelling, myalgias, morning stiffness and myalgias. Negative for gait problem, muscle weakness and muscle tenderness.  Skin:  Positive for rash. Negative for color change and sensitivity to sunlight.  Allergic/Immunologic: Negative for susceptible to infections.  Neurological:  Negative for dizziness and headaches.  Hematological:  Negative for swollen glands.  Psychiatric/Behavioral:  Negative for depressed mood and sleep disturbance. The patient is not nervous/anxious.     PMFS History:  Patient Active Problem List   Diagnosis Date Noted   Neck pain 03/31/2016   Acute pain of left shoulder 03/31/2016   ANA positive 12/29/2015   Ds DNA antibody positive 12/29/2015   Photosensitivity 12/29/2015   High risk medication use 12/29/2015   Other dysphagia 02/13/2013   Encounter for screening colonoscopy 02/13/2013    Past Medical History:  Diagnosis Date   Arthritis    Diabetes ([facility])    GERD (gastroesophageal reflux disease)    Hypercholesterolemia    Hypertension    Kidney calculus     Family History  Problem Relation Age of Onset   Dementia Father    Colon cancer Neg Hx    Past Surgical History:  Procedure Laterality Date   COLONOSCOPY, ESOPHAGOGASTRODUODENOSCOPY (EGD) AND ESOPHAGEAL DILATION N/A 03/07/2013   Procedure: COLONOSCOPY, ESOPHAGOGASTRODUODENOSCOPY (EGD)  AND ESOPHAGEAL DILATION;  Surgeon: Corbin Ade, MD;  Location[facility];  Service: Endoscopy;  Laterality: N/A;  11:15   ESOPHAGOGASTRODUODENOSCOPY     remote past in the 1990s with dilation   KIDNEY STONE SURGERY  2011   Social History   Social History Narrative   Not on file   Immunization History  Administered Date(s) Administered   Influenza, High Dose Seasonal PF 10/25/2017, 11/15/2018   Moderna  Sars-Covid-2 Vaccination 03/21/2019, 04/18/2019, 12/24/2019   Pneumococcal Polysaccharide-23 11/15/2018   Zoster Recombinat (Shingrix) 06/25/2017, 10/25/2017     Objective: Vital Signs: BP 111/76 (BP Location: Left Arm, Patient Position: Sitting, Cuff Size: Large)   Pulse 80   Resp 17   Ht 5\' 7"  (1.702 m)   Wt 209 lb 9.6 oz (95.1 kg)   BMI 32.83 kg/m    Physical Exam Vitals and nursing note reviewed.  Constitutional:      Appearance: He is well-developed.  HENT:     Head: Normocephalic and atraumatic.  Eyes:     Conjunctiva/sclera: Conjunctivae normal.     Pupils: Pupils are equal, round, and reactive to light.  Cardiovascular:     Rate and Rhythm: Normal rate and regular rhythm.     Heart sounds: Normal heart sounds.  Pulmonary:     Effort: Pulmonary effort is normal.     Breath sounds: Normal breath sounds.  Abdominal:     General: Bowel sounds are normal.     Palpations: Abdomen is soft.  Musculoskeletal:     Cervical back: Normal range of motion and neck supple.  Skin:    General: Skin is warm and dry.     Capillary Refill: Capillary refill takes less than 2 seconds.  Neurological:     Mental Status: He is alert and oriented to person, place, and time.  Psychiatric:        Behavior: Behavior normal.      Musculoskeletal Exam: C-spine, thoracic spine, lumbar spine have good range of motion.  Shoulder joints have good range of motion bilaterally.  Elbow joints, wrist joints, MCPs, PIPs, DIPs have good range of motion with no synovitis.  Some prominence over the left [facility] joint.  PIP and DIP thickening consistent with osteoarthritis of both hands.  Some tenderness over the left second MCP joint.  Hip joints have good range of motion with no groin pain.  Knee joints have good range of motion with no warmth or effusion.  Ankle joints have good range of motion with no joint tenderness or synovitis.  Tenderness over bilateral trochanteric bursa.  CDAI Exam: CDAI Score:  -- Patient Global: --; Provider Global: -- Swollen: --; Tender: -- Joint Exam 06/02/2022   No joint exam has been documented for this visit   There is currently no information documented on the homunculus. Go to the Rheumatology activity and complete the homunculus joint exam.  Investigation: No additional findings.  Imaging: No results found.  Recent Labs: Lab Results  Component Value Date   WBC 10.3 12/30/2021   HGB 15.6 12/30/2021   PLT 221 12/30/2021   NA 142 12/30/2021   K 4.3 12/30/2021   CL 101 12/30/2021   CO2 31 12/30/2021   GLUCOSE 136 (H) 12/30/2021   BUN 14 12/30/2021   CREATININE 0.90 12/30/2021   BILITOT 0.5 12/30/2021   ALKPHOS 46 09/01/2016   AST 20 12/30/2021   ALT 24 12/30/2021   PROT 6.7 12/30/2021   ALBUMIN 4.2 09/01/2016   CALCIUM 9.4 12/30/2021  GFRAA 93 04/16/2020    Speciality Comments: PLQ Eye Exam 11/11/2021  WNL Dr. Desiree Lucy, [place] f/u 1 year  Procedures:  No procedures performed Allergies: Patient has no known allergies.       Assessment / Plan:     Visit Diagnoses: Autoimmune disease ([facility]) - +ANA,+dsDNA, photosensitivity, sicca symptoms: He has not had any signs or symptoms of an autoimmune disease flare recently.  He has clinically been doing well taking Plaquenil 200 mg 1 tablet by mouth twice daily.  He continues to tolerate Plaquenil without any side effects and has not missed any doses recently.  He has intermittent pain and stiffness in both hands due to underlying osteoarthritis but had no synovitis on exam.  He continues to take turmeric, tart cherry, ginger, and fish oil on a daily basis.  He has ongoing photosensitivity but tries to wear sunscreen as well as sun protective gear if he plans on being outdoors.  He has not had any recent rashes.  No sores in his mouth or nose.  No cervical lymphadenopathy.  His energy level has been stable and he has been sleeping well at night.  He continues to have chronic sicca symptoms but  has been using Pataday eyedrops and Biotene products for symptomatic relief. Lab work from 12/30/21 was reviewed today in the office: no proteinuria, ESR WNL, complements WNL, and dsDNA is negative.  The following lab work will be updated today.  He will remain on Plaquenil as prescribed.  He was advised to notify us if he develops signs or symptoms of a flare.  Follow-up in the office in 5 months or sooner if needed.  - Plan: Protein / creatinine ratio, urine, CBC with Differential/Platelet, COMPLETE METABOLIC PANEL WITH GFR, Anti-DNA antibody, double-stranded, C3 and C4, Sedimentation rate  High risk medication use - Plaquenil 200 mg 1 tablet by mouth twice daily.  CBC and CMP updated on 12/30/21.  Orders for CBC and CMP released today.  PLQ Eye Exam 11/11/2021 WNL Dr. Desiree Lucy, [place] f/u 1 year  - Plan: CBC with Differential/Platelet, COMPLETE METABOLIC PANEL WITH GFR  Chronic left shoulder pain - X-rays obtained were unremarkable.  Subacromial bursa was injected on February 18, 2021.  He has good range of motion of the left shoulder joint on examination today.  Primary osteoarthritis of both hands: He has PIP and DIP thickening consistent with osteoarthritis of both hands.  No synovitis noted.  He continues to take turmeric, ginger, tart cherry, and omega-3.  He takes tramadol and/or ibuprofen as needed for pain relief.  Discussed the importance of joint protection and muscle strengthening.  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  He had decided against surgery in the past since his symptoms were tolerable.  Discussed the importance of joint protection and muscle strengthening.  Trochanteric bursitis of both hips: He experiences intermittent discomfort due to trochanter bursitis of both hips.  He has some tenderness to palpation over bilateral trochanteric bursa on examination today.  Patient was  encouraged to perform stretching exercises daily.  Other medical conditions are listed as follows:  History of diabetes mellitus  History of hypercholesterolemia  History of hypertension: Blood pressure was 111/76 today in the office.  History of gastroesophageal reflux (GERD)  Renal calcinosis  History of sleep apnea  Vitamin D deficiency  Orders: Orders Placed This Encounter  Procedures   Protein / creatinine ratio, urine   CBC with  Differential/Platelet   COMPLETE METABOLIC PANEL WITH GFR   Anti-DNA antibody, double-stranded   C3 and C4   Sedimentation rate   No orders of the defined types were placed in this encounter.   Follow-Up Instructions: Return in about 5 months (around 11/02/2022) for Autoimmune Disease.   Gearldine Bienenstock, PA-C  Note - This record has been created using Dragon software.  Chart creation errors have been sought, but may not always  have been located. Such creation errors do not reflect on  the standard of medical care.

## 2022-05-21 DIAGNOSIS — Z1212 Encounter for screening for malignant neoplasm of rectum: Secondary | ICD-10-CM | POA: Diagnosis not present

## 2022-05-21 DIAGNOSIS — Z1211 Encounter for screening for malignant neoplasm of colon: Secondary | ICD-10-CM | POA: Diagnosis not present

## 2022-05-23 DIAGNOSIS — G4733 Obstructive sleep apnea (adult) (pediatric): Secondary | ICD-10-CM | POA: Diagnosis not present

## 2022-06-02 ENCOUNTER — Ambulatory Visit: Payer: 59 | Attending: Physician Assistant | Admitting: Physician Assistant

## 2022-06-02 ENCOUNTER — Encounter: Payer: Self-pay | Admitting: Physician Assistant

## 2022-06-02 VITALS — BP 111/76 | HR 80 | Resp 17 | Ht 67.0 in | Wt 209.6 lb

## 2022-06-02 DIAGNOSIS — M1812 Unilateral primary osteoarthritis of first carpometacarpal joint, left hand: Secondary | ICD-10-CM

## 2022-06-02 DIAGNOSIS — N29 Other disorders of kidney and ureter in diseases classified elsewhere: Secondary | ICD-10-CM

## 2022-06-02 DIAGNOSIS — M7061 Trochanteric bursitis, right hip: Secondary | ICD-10-CM

## 2022-06-02 DIAGNOSIS — M19042 Primary osteoarthritis, left hand: Secondary | ICD-10-CM

## 2022-06-02 DIAGNOSIS — M19041 Primary osteoarthritis, right hand: Secondary | ICD-10-CM | POA: Diagnosis not present

## 2022-06-02 DIAGNOSIS — Z8679 Personal history of other diseases of the circulatory system: Secondary | ICD-10-CM

## 2022-06-02 DIAGNOSIS — G8929 Other chronic pain: Secondary | ICD-10-CM | POA: Diagnosis not present

## 2022-06-02 DIAGNOSIS — Z8639 Personal history of other endocrine, nutritional and metabolic disease: Secondary | ICD-10-CM | POA: Diagnosis not present

## 2022-06-02 DIAGNOSIS — E559 Vitamin D deficiency, unspecified: Secondary | ICD-10-CM

## 2022-06-02 DIAGNOSIS — M359 Systemic involvement of connective tissue, unspecified: Secondary | ICD-10-CM | POA: Diagnosis not present

## 2022-06-02 DIAGNOSIS — Z8669 Personal history of other diseases of the nervous system and sense organs: Secondary | ICD-10-CM

## 2022-06-02 DIAGNOSIS — Z79899 Other long term (current) drug therapy: Secondary | ICD-10-CM

## 2022-06-02 DIAGNOSIS — Z8719 Personal history of other diseases of the digestive system: Secondary | ICD-10-CM

## 2022-06-02 DIAGNOSIS — M25512 Pain in left shoulder: Secondary | ICD-10-CM | POA: Diagnosis not present

## 2022-06-02 DIAGNOSIS — M7062 Trochanteric bursitis, left hip: Secondary | ICD-10-CM

## 2022-06-03 LAB — CBC WITH DIFFERENTIAL/PLATELET
Absolute Monocytes: 837 cells/uL (ref 200–950)
Basophils Absolute: 122 cells/uL (ref 0–200)
Basophils Relative: 1.3 %
Eosinophils Absolute: 338 cells/uL (ref 15–500)
Eosinophils Relative: 3.6 %
HCT: 45.4 % (ref 38.5–50.0)
Hemoglobin: 15.2 g/dL (ref 13.2–17.1)
Lymphs Abs: 3610 cells/uL (ref 850–3900)
MCH: 30.5 pg (ref 27.0–33.0)
MCHC: 33.5 g/dL (ref 32.0–36.0)
MCV: 91 fL (ref 80.0–100.0)
MPV: 11.3 fL (ref 7.5–12.5)
Monocytes Relative: 8.9 %
Neutro Abs: 4493 cells/uL (ref 1500–7800)
Neutrophils Relative %: 47.8 %
Platelets: 215 10*3/uL (ref 140–400)
RBC: 4.99 10*6/uL (ref 4.20–5.80)
RDW: 12.5 % (ref 11.0–15.0)
Total Lymphocyte: 38.4 %
WBC: 9.4 10*3/uL (ref 3.8–10.8)

## 2022-06-03 LAB — COMPLETE METABOLIC PANEL WITH GFR
AG Ratio: 2 (calc) (ref 1.0–2.5)
ALT: 21 U/L (ref 9–46)
AST: 18 U/L (ref 10–35)
Albumin: 4.3 g/dL (ref 3.6–5.1)
Alkaline phosphatase (APISO): 46 U/L (ref 35–144)
BUN: 22 mg/dL (ref 7–25)
CO2: 30 mmol/L (ref 20–32)
Calcium: 9.4 mg/dL (ref 8.6–10.3)
Chloride: 101 mmol/L (ref 98–110)
Creat: 0.91 mg/dL (ref 0.70–1.35)
Globulin: 2.1 g/dL (calc) (ref 1.9–3.7)
Glucose, Bld: 114 mg/dL — ABNORMAL HIGH (ref 65–99)
Potassium: 4.1 mmol/L (ref 3.5–5.3)
Sodium: 142 mmol/L (ref 135–146)
Total Bilirubin: 0.5 mg/dL (ref 0.2–1.2)
Total Protein: 6.4 g/dL (ref 6.1–8.1)
eGFR: 91 mL/min/{1.73_m2} (ref >=60)

## 2022-06-03 LAB — C3 AND C4
C3 Complement: 132 mg/dL (ref 82–185)
C4 Complement: 23 mg/dL (ref 15–53)

## 2022-06-03 LAB — PROTEIN / CREATININE RATIO, URINE
Creatinine, Urine: 86 mg/dL (ref 20–320)
Protein/Creat Ratio: 116 mg/g creat (ref 25–148)
Protein/Creatinine Ratio: 0.116 mg/mg creat (ref 0.025–0.148)
Total Protein, Urine: 10 mg/dL (ref 5–25)

## 2022-06-03 LAB — SEDIMENTATION RATE: Sed Rate: 6 mm/h (ref 0–20)

## 2022-06-03 LAB — ANTI-DNA ANTIBODY, DOUBLE-STRANDED: ds DNA Ab: 1 IU/mL

## 2022-06-03 NOTE — Progress Notes (Signed)
Complements WNL

## 2022-06-03 NOTE — Progress Notes (Signed)
Glucose is 114. Rest of CMP WNL CBC WNL ESR WNL Protein creatinine ratio WNL

## 2022-06-06 NOTE — Progress Notes (Signed)
dsDNA is negative

## 2022-06-27 DIAGNOSIS — Z299 Encounter for prophylactic measures, unspecified: Secondary | ICD-10-CM | POA: Diagnosis not present

## 2022-06-27 DIAGNOSIS — I10 Essential (primary) hypertension: Secondary | ICD-10-CM | POA: Diagnosis not present

## 2022-06-27 DIAGNOSIS — Z0000 Encounter for general adult medical examination without abnormal findings: Secondary | ICD-10-CM | POA: Diagnosis not present

## 2022-06-27 DIAGNOSIS — M25512 Pain in left shoulder: Secondary | ICD-10-CM | POA: Diagnosis not present

## 2022-07-08 ENCOUNTER — Telehealth: Payer: Self-pay | Admitting: Rheumatology

## 2022-07-08 NOTE — Telephone Encounter (Signed)
Pharmacy is noted in patients chart. 

## 2022-07-08 NOTE — Telephone Encounter (Signed)
Patient called to let the office know that he has a NEW PHARMACY - [facility] at [address] in [place].

## 2022-07-27 ENCOUNTER — Other Ambulatory Visit: Payer: Self-pay | Admitting: *Deleted

## 2022-07-27 DIAGNOSIS — M359 Systemic involvement of connective tissue, unspecified: Secondary | ICD-10-CM

## 2022-07-27 MED ORDER — HYDROXYCHLOROQUINE SULFATE 200 MG PO TABS: 200.0000 mg | ORAL_TABLET | Freq: Two times a day (BID) | ORAL | 0 refills | Status: DC

## 2022-07-27 NOTE — Telephone Encounter (Signed)
Last Fill: 04/29/2022  Eye exam: 11/11/2021 Glucose is 114. Rest of CMP WNL CBC WNL ESR WNL Protein creatinine ratio WNL Complements WNL   dsDNA is negative  Labs: 06/02/2022   Next Visit: 12/01/2022  Last Visit: 06/02/2022  DX: Autoimmune disease   Current Dose per office note 06/02/2022: Plaquenil 200 mg 1 tablet by mouth twice daily.   Okay to refill Plaquenil?

## 2022-08-04 DIAGNOSIS — Z299 Encounter for prophylactic measures, unspecified: Secondary | ICD-10-CM | POA: Diagnosis not present

## 2022-08-04 DIAGNOSIS — I10 Essential (primary) hypertension: Secondary | ICD-10-CM | POA: Diagnosis not present

## 2022-08-04 DIAGNOSIS — E1165 Type 2 diabetes mellitus with hyperglycemia: Secondary | ICD-10-CM | POA: Diagnosis not present

## 2022-08-24 DIAGNOSIS — G4733 Obstructive sleep apnea (adult) (pediatric): Secondary | ICD-10-CM | POA: Diagnosis not present

## 2022-09-27 DIAGNOSIS — Z299 Encounter for prophylactic measures, unspecified: Secondary | ICD-10-CM | POA: Diagnosis not present

## 2022-09-27 DIAGNOSIS — M329 Systemic lupus erythematosus, unspecified: Secondary | ICD-10-CM | POA: Diagnosis not present

## 2022-09-27 DIAGNOSIS — G8929 Other chronic pain: Secondary | ICD-10-CM | POA: Diagnosis not present

## 2022-09-27 DIAGNOSIS — I10 Essential (primary) hypertension: Secondary | ICD-10-CM | POA: Diagnosis not present

## 2022-10-21 ENCOUNTER — Other Ambulatory Visit: Payer: Self-pay | Admitting: Physician Assistant

## 2022-10-21 DIAGNOSIS — M359 Systemic involvement of connective tissue, unspecified: Secondary | ICD-10-CM

## 2022-10-21 NOTE — Telephone Encounter (Signed)
Last Fill: 07/27/2022  Eye exam: 11/11/2021 WNL   Labs: 06/02/2022 Glucose is 114. Rest of CMP WNL CBC WNL ESR WNL Protein creatinine ratio WNL Complements WNL  dsDNA is negative   Next Visit: 12/01/2022  Last Visit: 06/02/2022  DX:Autoimmune disease   Current Dose per office note 06/02/2022: Plaquenil 200 mg 1 tablet by mouth twice daily.   Attempted to contact the patient and advised labs are due. Phone call would not go through and could not leave a voice message.   Okay to refill Plaquenil?

## 2022-11-01 DIAGNOSIS — Y92009 Unspecified place in unspecified non-institutional (private) residence as the place of occurrence of the external cause: Secondary | ICD-10-CM | POA: Diagnosis not present

## 2022-11-01 DIAGNOSIS — R809 Proteinuria, unspecified: Secondary | ICD-10-CM | POA: Diagnosis not present

## 2022-11-01 DIAGNOSIS — E1129 Type 2 diabetes mellitus with other diabetic kidney complication: Secondary | ICD-10-CM | POA: Diagnosis not present

## 2022-11-01 DIAGNOSIS — S0083XA Contusion of other part of head, initial encounter: Secondary | ICD-10-CM | POA: Diagnosis not present

## 2022-11-02 ENCOUNTER — Other Ambulatory Visit: Payer: Self-pay | Admitting: Physician Assistant

## 2022-11-02 ENCOUNTER — Telehealth: Payer: Self-pay

## 2022-11-02 DIAGNOSIS — M359 Systemic involvement of connective tissue, unspecified: Secondary | ICD-10-CM

## 2022-11-02 NOTE — Telephone Encounter (Signed)
Patient contacted the office stating he needs a refill of hydroxychloroquine. Advised the patient there was a refill sent to [facility] on [address] on 10/23/2022. Patient verbalized understanding.

## 2022-11-08 DIAGNOSIS — I10 Essential (primary) hypertension: Secondary | ICD-10-CM | POA: Diagnosis not present

## 2022-11-08 DIAGNOSIS — E1165 Type 2 diabetes mellitus with hyperglycemia: Secondary | ICD-10-CM | POA: Diagnosis not present

## 2022-11-08 DIAGNOSIS — Z299 Encounter for prophylactic measures, unspecified: Secondary | ICD-10-CM | POA: Diagnosis not present

## 2022-11-16 DIAGNOSIS — G4733 Obstructive sleep apnea (adult) (pediatric): Secondary | ICD-10-CM | POA: Diagnosis not present

## 2022-11-22 DIAGNOSIS — H40013 Open angle with borderline findings, low risk, bilateral: Secondary | ICD-10-CM | POA: Diagnosis not present

## 2022-11-22 LAB — HM DIABETES EYE EXAM

## 2022-11-28 NOTE — Progress Notes (Unsigned)
Office Visit Note  Patient: Daryl Bell             Date of Birth: 02-12-1950           MRN: [id number]             PCP: Kirstie Peri, MD Referring: Kirstie Peri, MD Visit Date: 12/01/2022 Occupation: @GUAROCC @  Subjective:  No chief complaint on file.   History of Present Illness: CLEAVE TERNES is a 72 y.o. male ***     Activities of Daily Living:  Patient reports morning stiffness for *** {minute/hour:19697}.   Patient {ACTIONS;DENIES/REPORTS:21021675::"Denies"} nocturnal pain.  Difficulty dressing/grooming: {ACTIONS;DENIES/REPORTS:21021675::"Denies"} Difficulty climbing stairs: {ACTIONS;DENIES/REPORTS:21021675::"Denies"} Difficulty getting out of chair: {ACTIONS;DENIES/REPORTS:21021675::"Denies"} Difficulty using hands for taps, buttons, cutlery, and/or writing: {ACTIONS;DENIES/REPORTS:21021675::"Denies"}  No Rheumatology ROS completed.   PMFS History:  Patient Active Problem List   Diagnosis Date Noted   Neck pain 03/31/2016   Acute pain of left shoulder 03/31/2016   ANA positive 12/29/2015   Ds DNA antibody positive 12/29/2015   Photosensitivity 12/29/2015   High risk medication use 12/29/2015   Other dysphagia 02/13/2013   Encounter for screening colonoscopy 02/13/2013    Past Medical History:  Diagnosis Date   Arthritis    Diabetes (HCC)    GERD (gastroesophageal reflux disease)    Hypercholesterolemia    Hypertension    Kidney calculus     Family History  Problem Relation Age of Onset   Dementia Father    Colon cancer Neg Hx    Past Surgical History:  Procedure Laterality Date   COLONOSCOPY, ESOPHAGOGASTRODUODENOSCOPY (EGD) AND ESOPHAGEAL DILATION N/A 03/07/2013   Procedure: COLONOSCOPY, ESOPHAGOGASTRODUODENOSCOPY (EGD) AND ESOPHAGEAL DILATION;  Surgeon: Corbin Ade, MD;  Location: [facility];  Service: Endoscopy;  Laterality: N/A;  11:15   ESOPHAGOGASTRODUODENOSCOPY     remote past in the 1990s with dilation   KIDNEY STONE  SURGERY  2011   Social History   Social History Narrative   Not on file   Immunization History  Administered Date(s) Administered   Influenza, High Dose Seasonal PF 10/25/2017, 11/15/2018   Moderna Sars-Covid-2 Vaccination 03/21/2019, 04/18/2019, 12/24/2019   Pneumococcal Polysaccharide-23 11/15/2018   Zoster Recombinant(Shingrix) 06/25/2017, 10/25/2017     Objective: Vital Signs: There were no vitals taken for this visit.   Physical Exam   Musculoskeletal Exam: ***  CDAI Exam: CDAI Score: -- Patient Global: --; Provider Global: -- Swollen: --; Tender: -- Joint Exam 12/01/2022   No joint exam has been documented for this visit   There is currently no information documented on the homunculus. Go to the Rheumatology activity and complete the homunculus joint exam.  Investigation: No additional findings.  Imaging: No results found.  Recent Labs: Lab Results  Component Value Date   WBC 9.4 06/02/2022   HGB 15.2 06/02/2022   PLT 215 06/02/2022   NA 142 06/02/2022   K 4.1 06/02/2022   CL 101 06/02/2022   CO2 30 06/02/2022   GLUCOSE 114 (H) 06/02/2022   BUN 22 06/02/2022   CREATININE 0.91 06/02/2022   BILITOT 0.5 06/02/2022   ALKPHOS 46 09/01/2016   AST 18 06/02/2022   ALT 21 06/02/2022   PROT 6.4 06/02/2022   ALBUMIN 4.2 09/01/2016   CALCIUM 9.4 06/02/2022   GFRAA 93 04/16/2020    Speciality Comments: PLQ Eye Exam 11/11/2021  WNL Dr. Desiree Lucy, [place] f/u 1 year  Procedures:  No procedures performed Allergies: Patient has no known allergies.   Assessment / Plan:  Visit Diagnoses: No diagnosis found.  Orders: No orders of the defined types were placed in this encounter.  No orders of the defined types were placed in this encounter.   Face-to-face time spent with patient was *** minutes. Greater than 50% of time was spent in counseling and coordination of care.  Follow-Up Instructions: No follow-ups on file.   Ellen Henri,  CMA  Note - This record has been created using Animal nutritionist.  Chart creation errors have been sought, but may not always  have been located. Such creation errors do not reflect on  the standard of medical care.

## 2022-11-30 ENCOUNTER — Other Ambulatory Visit: Payer: Self-pay | Admitting: *Deleted

## 2022-11-30 DIAGNOSIS — M359 Systemic involvement of connective tissue, unspecified: Secondary | ICD-10-CM

## 2022-11-30 MED ORDER — HYDROXYCHLOROQUINE SULFATE 200 MG PO TABS
200.0000 mg | ORAL_TABLET | Freq: Two times a day (BID) | ORAL | 2 refills | Status: DC
Start: 2022-11-30 — End: 2023-02-27

## 2022-11-30 NOTE — Telephone Encounter (Signed)
Refill request received via fax from [facility]  for Plaquenil  Last Fill: 10/23/2022 (30 day supply)  Eye exam: 11/22/2022 WNL    Labs: 06/02/2022 Glucose is 114. Rest of CMP WNL CBC WNL  Next Visit: 12/01/2022  Last Visit: 06/02/2022  DX::Autoimmune disease   Current Dose per office note 06/02/2022:  Plaquenil 200 mg 1 tablet by mouth twice daily.   Patient to update labs at upcoming appointment tomorrow.   Okay to refill Plaquenil?

## 2022-12-01 ENCOUNTER — Ambulatory Visit: Payer: 59 | Attending: Rheumatology | Admitting: Rheumatology

## 2022-12-01 ENCOUNTER — Encounter: Payer: Self-pay | Admitting: Rheumatology

## 2022-12-01 VITALS — BP 128/88 | HR 87 | Resp 14 | Ht 67.0 in | Wt 211.6 lb

## 2022-12-01 DIAGNOSIS — M7712 Lateral epicondylitis, left elbow: Secondary | ICD-10-CM | POA: Diagnosis not present

## 2022-12-01 DIAGNOSIS — M7061 Trochanteric bursitis, right hip: Secondary | ICD-10-CM

## 2022-12-01 DIAGNOSIS — Z8679 Personal history of other diseases of the circulatory system: Secondary | ICD-10-CM | POA: Diagnosis not present

## 2022-12-01 DIAGNOSIS — Z79899 Other long term (current) drug therapy: Secondary | ICD-10-CM | POA: Diagnosis not present

## 2022-12-01 DIAGNOSIS — M359 Systemic involvement of connective tissue, unspecified: Secondary | ICD-10-CM | POA: Diagnosis not present

## 2022-12-01 DIAGNOSIS — Z8719 Personal history of other diseases of the digestive system: Secondary | ICD-10-CM | POA: Diagnosis not present

## 2022-12-01 DIAGNOSIS — M19041 Primary osteoarthritis, right hand: Secondary | ICD-10-CM

## 2022-12-01 DIAGNOSIS — Z8639 Personal history of other endocrine, nutritional and metabolic disease: Secondary | ICD-10-CM | POA: Diagnosis not present

## 2022-12-01 DIAGNOSIS — M7062 Trochanteric bursitis, left hip: Secondary | ICD-10-CM

## 2022-12-01 DIAGNOSIS — M25512 Pain in left shoulder: Secondary | ICD-10-CM

## 2022-12-01 DIAGNOSIS — M1812 Unilateral primary osteoarthritis of first carpometacarpal joint, left hand: Secondary | ICD-10-CM | POA: Diagnosis not present

## 2022-12-01 DIAGNOSIS — M19042 Primary osteoarthritis, left hand: Secondary | ICD-10-CM

## 2022-12-01 DIAGNOSIS — Z8669 Personal history of other diseases of the nervous system and sense organs: Secondary | ICD-10-CM | POA: Diagnosis not present

## 2022-12-01 DIAGNOSIS — G8929 Other chronic pain: Secondary | ICD-10-CM

## 2022-12-01 DIAGNOSIS — N29 Other disorders of kidney and ureter in diseases classified elsewhere: Secondary | ICD-10-CM

## 2022-12-01 DIAGNOSIS — E559 Vitamin D deficiency, unspecified: Secondary | ICD-10-CM

## 2022-12-01 NOTE — Patient Instructions (Signed)

## 2022-12-02 NOTE — Progress Notes (Signed)
White cell count is mildly elevated.  Please ask patient if he had any recent infection, prednisone use of a steroid injection.  Glucose is elevated at 148.  Urine protein creatinine ratio normal, complements normal, sed rate normal, double-stranded DNA is pending.  Labs do not indicate an autoimmune disease flare.

## 2022-12-03 LAB — ANTI-DNA ANTIBODY, DOUBLE-STRANDED: ds DNA Ab: 2 [IU]/mL

## 2022-12-03 LAB — COMPLETE METABOLIC PANEL WITH GFR
AG Ratio: 2 (calc) (ref 1.0–2.5)
ALT: 25 U/L (ref 9–46)
AST: 20 U/L (ref 10–35)
Albumin: 4.3 g/dL (ref 3.6–5.1)
Alkaline phosphatase (APISO): 59 U/L (ref 35–144)
BUN: 16 mg/dL (ref 7–25)
CO2: 31 mmol/L (ref 20–32)
Calcium: 9.3 mg/dL (ref 8.6–10.3)
Chloride: 99 mmol/L (ref 98–110)
Creat: 0.97 mg/dL (ref 0.70–1.28)
Globulin: 2.2 g/dL (ref 1.9–3.7)
Glucose, Bld: 148 mg/dL — ABNORMAL HIGH (ref 65–99)
Potassium: 4 mmol/L (ref 3.5–5.3)
Sodium: 141 mmol/L (ref 135–146)
Total Bilirubin: 0.5 mg/dL (ref 0.2–1.2)
Total Protein: 6.5 g/dL (ref 6.1–8.1)
eGFR: 84 mL/min/{1.73_m2} (ref >=60)

## 2022-12-03 LAB — CBC WITH DIFFERENTIAL/PLATELET
Absolute Lymphocytes: 4463 {cells}/uL — ABNORMAL HIGH (ref 850–3900)
Absolute Monocytes: 1013 {cells}/uL — ABNORMAL HIGH (ref 200–950)
Basophils Absolute: 138 {cells}/uL (ref 0–200)
Basophils Relative: 1.1 %
Eosinophils Absolute: 1575 {cells}/uL — ABNORMAL HIGH (ref 15–500)
Eosinophils Relative: 12.6 %
HCT: 48.4 % (ref 38.5–50.0)
Hemoglobin: 15.7 g/dL (ref 13.2–17.1)
MCH: 28.3 pg (ref 27.0–33.0)
MCHC: 32.4 g/dL (ref 32.0–36.0)
MCV: 87.2 fL (ref 80.0–100.0)
MPV: 10.9 fL (ref 7.5–12.5)
Monocytes Relative: 8.1 %
Neutro Abs: 5313 {cells}/uL (ref 1500–7800)
Neutrophils Relative %: 42.5 %
Platelets: 243 10*3/uL (ref 140–400)
RBC: 5.55 10*6/uL (ref 4.20–5.80)
RDW: 14.5 % (ref 11.0–15.0)
Total Lymphocyte: 35.7 %
WBC: 12.5 10*3/uL — ABNORMAL HIGH (ref 3.8–10.8)

## 2022-12-03 LAB — PROTEIN / CREATININE RATIO, URINE
Creatinine, Urine: 96 mg/dL (ref 20–320)
Protein/Creat Ratio: 83 mg/g{creat} (ref 25–148)
Protein/Creatinine Ratio: 0.083 mg/mg{creat} (ref 0.025–0.148)
Total Protein, Urine: 8 mg/dL (ref 5–25)

## 2022-12-03 LAB — SEDIMENTATION RATE: Sed Rate: 6 mm/h (ref 0–20)

## 2022-12-03 LAB — C3 AND C4
C3 Complement: 159 mg/dL (ref 82–185)
C4 Complement: 24 mg/dL (ref 15–53)

## 2023-01-12 DIAGNOSIS — G8929 Other chronic pain: Secondary | ICD-10-CM | POA: Diagnosis not present

## 2023-01-12 DIAGNOSIS — I10 Essential (primary) hypertension: Secondary | ICD-10-CM | POA: Diagnosis not present

## 2023-02-14 DIAGNOSIS — G4733 Obstructive sleep apnea (adult) (pediatric): Secondary | ICD-10-CM | POA: Diagnosis not present

## 2023-02-21 DIAGNOSIS — Z299 Encounter for prophylactic measures, unspecified: Secondary | ICD-10-CM | POA: Diagnosis not present

## 2023-02-21 DIAGNOSIS — E1165 Type 2 diabetes mellitus with hyperglycemia: Secondary | ICD-10-CM | POA: Diagnosis not present

## 2023-02-21 DIAGNOSIS — E1142 Type 2 diabetes mellitus with diabetic polyneuropathy: Secondary | ICD-10-CM | POA: Diagnosis not present

## 2023-02-21 DIAGNOSIS — I10 Essential (primary) hypertension: Secondary | ICD-10-CM | POA: Diagnosis not present

## 2023-02-21 DIAGNOSIS — G4730 Sleep apnea, unspecified: Secondary | ICD-10-CM | POA: Diagnosis not present

## 2023-02-26 ENCOUNTER — Other Ambulatory Visit: Payer: Self-pay | Admitting: Rheumatology

## 2023-02-26 DIAGNOSIS — M359 Systemic involvement of connective tissue, unspecified: Secondary | ICD-10-CM

## 2023-02-27 NOTE — Telephone Encounter (Signed)
Last Fill: 11/30/2022  Eye exam: 11/22/2022 WNL   Labs: 12/01/2022 White cell count is mildly elevated. Glucose is elevated at 148.  Urine protein creatinine ratio normal, complements normal, sed rate normal, double-stranded DNA is pending.  Labs do not indicate an autoimmune disease flare.   Next Visit: 05/18/2023  Last Visit: 12/01/2022  DX:Autoimmune disease   Current Dose per office note 12/01/2022: Plaquenil 200 mg 1 tablet by mouth twice daily.   Okay to refill Plaquenil?

## 2023-04-06 DIAGNOSIS — H40033 Anatomical narrow angle, bilateral: Secondary | ICD-10-CM | POA: Diagnosis not present

## 2023-04-11 DIAGNOSIS — I10 Essential (primary) hypertension: Secondary | ICD-10-CM | POA: Diagnosis not present

## 2023-04-11 DIAGNOSIS — Z299 Encounter for prophylactic measures, unspecified: Secondary | ICD-10-CM | POA: Diagnosis not present

## 2023-04-11 DIAGNOSIS — E1169 Type 2 diabetes mellitus with other specified complication: Secondary | ICD-10-CM | POA: Diagnosis not present

## 2023-04-11 DIAGNOSIS — G8929 Other chronic pain: Secondary | ICD-10-CM | POA: Diagnosis not present

## 2023-04-11 DIAGNOSIS — R52 Pain, unspecified: Secondary | ICD-10-CM | POA: Diagnosis not present

## 2023-05-05 NOTE — Progress Notes (Signed)
 Office Visit Note  Patient: Daryl Bell             Date of Birth: 13-Sep-1950           MRN: [id number]             PCP: Theoplis Fix, MD Referring: Theoplis Fix, MD Visit Date: 05/18/2023 Occupation: @GUAROCC @  Subjective:  Fatigue  History of Present Illness: Daryl Bell is a 73 y.o. male with autoimmune disease.  He returns today after his last visit in November 2024.  He believes that his CPAP machine has not been working well.  He has been experiencing increased fatigue.  He continues to have intermittent discomfort in his left shoulder while he works.  He has some stiffness in his hands.  He has been having pain in his right trochanteric bursa recently.  The discomfort in his left elbow joint has resolved.  He denies any history of oral ulcers.  He continues to have dry mouth and dry eyes, photosensitivity and joint pain.  There is no history of Raynaud's phenomenon or lymphadenopathy.  He continues to take hydroxychloroquine  200 mg p.o. twice daily.    Activities of Daily Living:  Patient reports morning stiffness for 1-2 hour.   Patient Denies nocturnal pain.  Difficulty dressing/grooming: Denies Difficulty climbing stairs: Denies Difficulty getting out of chair: Reports Difficulty using hands for taps, buttons, cutlery, and/or writing: Denies  Review of Systems  Constitutional:  Positive for fatigue.  HENT:  Positive for mouth dryness. Negative for mouth sores.   Eyes:  Positive for dryness.  Respiratory:  Negative for shortness of breath.   Cardiovascular:  Negative for chest pain and palpitations.  Gastrointestinal:  Negative for blood in stool, constipation and diarrhea.  Endocrine: Negative for increased urination.  Genitourinary:  Negative for involuntary urination.  Musculoskeletal:  Positive for joint pain, joint pain, myalgias, morning stiffness and myalgias. Negative for gait problem, joint swelling, muscle weakness and muscle tenderness.  Skin:   Positive for sensitivity to sunlight. Negative for color change and hair loss.  Allergic/Immunologic: Negative for susceptible to infections.  Neurological:  Positive for headaches. Negative for dizziness.  Hematological:  Negative for swollen glands.  Psychiatric/Behavioral:  Positive for sleep disturbance. Negative for depressed mood. The patient is not nervous/anxious.     PMFS History:  Patient Active Problem List   Diagnosis Date Noted   Neck pain 03/31/2016   Acute pain of left shoulder 03/31/2016   ANA positive 12/29/2015   Ds DNA antibody positive 12/29/2015   Photosensitivity 12/29/2015   High risk medication use 12/29/2015   Other dysphagia 02/13/2013   Encounter for screening colonoscopy 02/13/2013    Past Medical History:  Diagnosis Date   Arthritis    Diabetes (HCC)    GERD (gastroesophageal reflux disease)    Hypercholesterolemia    Hypertension    Kidney calculus     Family History  Problem Relation Age of Onset   Dementia Father    Colon cancer Neg Hx    Past Surgical History:  Procedure Laterality Date   COLONOSCOPY, ESOPHAGOGASTRODUODENOSCOPY (EGD) AND ESOPHAGEAL DILATION N/A 03/07/2013   Procedure: COLONOSCOPY, ESOPHAGOGASTRODUODENOSCOPY (EGD) AND ESOPHAGEAL DILATION;  Surgeon: Suzette Espy, MD;  Location: [facility];  Service: Endoscopy;  Laterality: N/A;  11:15   ESOPHAGOGASTRODUODENOSCOPY     remote past in the 1990s with dilation   KIDNEY STONE SURGERY  2011   Social History   Social History Narrative   Not on  file   Immunization History  Administered Date(s) Administered   Influenza, High Dose Seasonal PF 10/25/2017, 11/15/2018   Moderna Sars-Covid-2 Vaccination 03/21/2019, 04/18/2019, 12/24/2019   Pneumococcal Polysaccharide-23 11/15/2018   Zoster Recombinant(Shingrix) 06/25/2017, 10/25/2017     Objective: Vital Signs: BP 112/68 (BP Location: Left Arm, Patient Position: Sitting, Cuff Size: Normal)   Pulse 71   Resp 16   Ht 5\' 8"   (1.727 m)   Wt 205 lb 9.6 oz (93.3 kg)   BMI 31.26 kg/m    Physical Exam Vitals and nursing note reviewed.  Constitutional:      Appearance: He is well-developed.  HENT:     Head: Normocephalic and atraumatic.  Eyes:     Conjunctiva/sclera: Conjunctivae normal.     Pupils: Pupils are equal, round, and reactive to light.  Cardiovascular:     Rate and Rhythm: Normal rate and regular rhythm.     Heart sounds: Normal heart sounds.  Pulmonary:     Effort: Pulmonary effort is normal.     Breath sounds: Normal breath sounds.  Abdominal:     General: Bowel sounds are normal.     Palpations: Abdomen is soft.  Musculoskeletal:     Cervical back: Normal range of motion and neck supple.  Skin:    General: Skin is warm and dry.     Capillary Refill: Capillary refill takes less than 2 seconds.  Neurological:     Mental Status: He is alert and oriented to person, place, and time.  Psychiatric:        Behavior: Behavior normal.      Musculoskeletal Exam: Cervical, thoracic and lumbar spine were in good range of motion.  He had some limitation with extension of the cervical spine.  Shoulders, elbows, wrist joints were in good range of motion.  He had bilateral CMC, PIP and DIP thickening with no synovitis.  Hip joints and knee joints were in good range of motion without any warmth swelling or effusion.  There was no tenderness over ankles or MTPs.  CDAI Exam: CDAI Score: -- Patient Global: --; Provider Global: -- Swollen: --; Tender: -- Joint Exam 05/18/2023   No joint exam has been documented for this visit   There is currently no information documented on the homunculus. Go to the Rheumatology activity and complete the homunculus joint exam.  Investigation: No additional findings.  Imaging: No results found.  Recent Labs: Lab Results  Component Value Date   WBC 12.5 (H) 12/01/2022   HGB 15.7 12/01/2022   PLT 243 12/01/2022   NA 141 12/01/2022   K 4.0 12/01/2022   CL 99  12/01/2022   CO2 31 12/01/2022   GLUCOSE 148 (H) 12/01/2022   BUN 16 12/01/2022   CREATININE 0.97 12/01/2022   BILITOT 0.5 12/01/2022   ALKPHOS 46 09/01/2016   AST 20 12/01/2022   ALT 25 12/01/2022   PROT 6.5 12/01/2022   ALBUMIN 4.2 09/01/2016   CALCIUM 9.3 12/01/2022   GFRAA 93 04/16/2020    Speciality Comments: PLQ Eye Exam 11/22/2022 WNL Dr. Patria Bookbinder, [place] f/u 1 year  Procedures:  No procedures performed Allergies: Patient has no known allergies.   Assessment / Plan:     Visit Diagnoses: Autoimmune disease (HCC) - +ANA,+dsDNA, photosensitivity, sicca symptoms: -He has been experiencing fatigue, sicca symptoms, arthralgias.  No synovitis was noted on the examination.  His labs have been consistently negative over the last couple of years.  I discussed decreasing the dose of hydroxychloroquine  to 200  mg p.o. twice daily Monday to Friday as his labs have been stable.  Will get labs again today.  Plan: Protein / creatinine ratio, urine, ANA, Anti-DNA antibody, double-stranded, C3 and C4, Sedimentation rate  High risk medication use - Plaquenil  200 mg 1 tablet by mouth twice daily. PLQ Eye Exam November 22, 2022.  Patient was advised to get annual eye examination to monitor for ocular toxicity while he is on Plaquenil .- Plan: Comprehensive metabolic panel with GFR, CBC with Differential/Platelet.  In sugar immunization was placed in the AVS.  Chronic left shoulder pain-he reports discomfort in his left shoulder joint only when he is doing activities.  Primary osteoarthritis of both hands-bilateral PIP and DIP thickening with no synovitis was noted.  Joint protection was discussed.  Arthritis of carpometacarpal (CMC) joint of left thumb-he experiences more pain with activities.  Trochanteric bursitis of both hips-he has been having increased discomfort in the right trochanteric region.  A handout on IT band stretches was given.  Other medical problems are listed as  follows:  History of diabetes mellitus  History of hypercholesterolemia  History of hypertension  Renal calcinosis  History of gastroesophageal reflux (GERD)  History of sleep apnea  Vitamin D  deficiency  Orders: Orders Placed This Encounter  Procedures   Protein / creatinine ratio, urine   Comprehensive metabolic panel with GFR   CBC with Differential/Platelet   ANA   Anti-DNA antibody, double-stranded   C3 and C4   Sedimentation rate   Meds ordered this encounter  Medications   hydroxychloroquine  (PLAQUENIL ) 200 MG tablet    Sig: Take 1 tablet 200 mg BID Monday-Friday    Follow-Up Instructions: Return in about 5 months (around 10/18/2023) for Autoimmune disease, Osteoarthritis.   Nicholas Bari, MD  Note - This record has been created using Animal nutritionist.  Chart creation errors have been sought, but may not always  have been located. Such creation errors do not reflect on  the standard of medical care.

## 2023-05-10 DIAGNOSIS — G4733 Obstructive sleep apnea (adult) (pediatric): Secondary | ICD-10-CM | POA: Diagnosis not present

## 2023-05-18 ENCOUNTER — Ambulatory Visit: Payer: 59 | Attending: Rheumatology | Admitting: Rheumatology

## 2023-05-18 ENCOUNTER — Encounter: Payer: Self-pay | Admitting: Rheumatology

## 2023-05-18 VITALS — BP 112/68 | HR 71 | Resp 16 | Ht 68.0 in | Wt 205.6 lb

## 2023-05-18 DIAGNOSIS — G8929 Other chronic pain: Secondary | ICD-10-CM | POA: Diagnosis not present

## 2023-05-18 DIAGNOSIS — M1812 Unilateral primary osteoarthritis of first carpometacarpal joint, left hand: Secondary | ICD-10-CM | POA: Diagnosis not present

## 2023-05-18 DIAGNOSIS — Z8639 Personal history of other endocrine, nutritional and metabolic disease: Secondary | ICD-10-CM

## 2023-05-18 DIAGNOSIS — Z8719 Personal history of other diseases of the digestive system: Secondary | ICD-10-CM | POA: Diagnosis not present

## 2023-05-18 DIAGNOSIS — M25512 Pain in left shoulder: Secondary | ICD-10-CM

## 2023-05-18 DIAGNOSIS — N29 Other disorders of kidney and ureter in diseases classified elsewhere: Secondary | ICD-10-CM

## 2023-05-18 DIAGNOSIS — M19041 Primary osteoarthritis, right hand: Secondary | ICD-10-CM

## 2023-05-18 DIAGNOSIS — Z8669 Personal history of other diseases of the nervous system and sense organs: Secondary | ICD-10-CM

## 2023-05-18 DIAGNOSIS — Z79899 Other long term (current) drug therapy: Secondary | ICD-10-CM

## 2023-05-18 DIAGNOSIS — M359 Systemic involvement of connective tissue, unspecified: Secondary | ICD-10-CM | POA: Diagnosis not present

## 2023-05-18 DIAGNOSIS — Z8679 Personal history of other diseases of the circulatory system: Secondary | ICD-10-CM | POA: Diagnosis not present

## 2023-05-18 DIAGNOSIS — M7061 Trochanteric bursitis, right hip: Secondary | ICD-10-CM

## 2023-05-18 DIAGNOSIS — M19042 Primary osteoarthritis, left hand: Secondary | ICD-10-CM

## 2023-05-18 DIAGNOSIS — E559 Vitamin D deficiency, unspecified: Secondary | ICD-10-CM

## 2023-05-18 DIAGNOSIS — M7062 Trochanteric bursitis, left hip: Secondary | ICD-10-CM

## 2023-05-18 DIAGNOSIS — M7712 Lateral epicondylitis, left elbow: Secondary | ICD-10-CM

## 2023-05-18 MED ORDER — HYDROXYCHLOROQUINE SULFATE 200 MG PO TABS: ORAL_TABLET | ORAL | Status: DC

## 2023-05-18 NOTE — Patient Instructions (Addendum)
 Vaccines You are taking a medication(s) that can suppress your immune system.  The following immunizations are recommended: Flu annually Covid-19  Td/Tdap (tetanus, diphtheria, pertussis) every 10 years Pneumonia (Prevnar 15 then Pneumovax 23 at least 1 year apart.  Alternatively, can take Prevnar 20 without needing additional dose) Shingrix: 2 doses from 4 weeks to 6 months apart  Please check with your PCP to make sure you are up to date.   Iliotibial Band Syndrome Rehab Ask your health care provider which exercises are safe for you. Do exercises exactly as told by your provider and adjust them as told. It's normal to feel mild stretching, pulling, tightness, or discomfort as you do these exercises. Stop right away if you feel sudden pain or your pain gets a lot worse. Do not begin these exercises until told by your provider. Stretching and range-of-motion exercises These exercises warm up your muscles and joints. They also improve the movement and flexibility of your hip and pelvis. Quadriceps stretch, prone  Lie face down (prone) on a firm surface like a bed or padded floor. Bend your left / right knee. Reach back to hold your ankle or pant leg. If you can't reach your ankle or pant leg, use a belt looped around your foot and grab the belt instead. Gently pull your heel toward your butt. Your knee should not slide out to the side. You should feel a stretch in the front of your thigh and knee, also called the quadriceps. Hold this position for __________ seconds. Repeat __________ times. Complete this exercise __________ times a day. Iliotibial band stretch The iliotibial band is a strip of tissue that runs along the outside of your hip down to your knee. Lie on your side with your left / right leg on top. Bend both knees and grab your left / right ankle. Stretch out your bottom arm to help you balance. Slowly bring your top knee back so your thigh goes behind your back. Slowly lower  your top leg toward the floor until you feel a gentle stretch on the outside of your left / right hip and thigh. If you don't feel a stretch and your knee won't go farther, place the heel of your other foot on top of your knee and pull your knee down toward the floor with your foot. Hold this position for __________ seconds. Repeat __________ times. Complete this exercise __________ times a day. Strengthening exercises These exercises build strength and endurance in your hip and pelvis. Endurance means your muscles can keep working even when they're tired. Straight leg raises, side-lying This exercise strengthens the muscles that rotate the leg at the hip and move it away from your body. These muscles are called hip abductors. Lie on your side with your left / right leg on top. Lie so your head, shoulder, hip, and knee line up. You can bend your bottom knee to help you balance. Roll your hips slightly forward so they're stacked directly over each other. Your left / right knee should face forward. Tense the muscles in your outer thigh and hip. Lift your top leg 4-6 inches (10-15 cm) off the ground. Hold this position for __________ seconds. Slowly lower your leg back down to the starting position. Let your muscles fully relax before doing this exercise again. Repeat __________ times. Complete this exercise __________ times a day. Leg raises, prone This exercise strengthens the muscles that move the hips backward. These muscles are called hip extensors. Lie face down (prone) on  your bed or a firm surface. You can put a pillow under your hips for comfort and to support your lower back. Bend your left / right knee so your foot points straight up toward the ceiling. Keep the other leg straight and behind you. Squeeze your butt muscles. Lift your left / right thigh off the firm surface. Do not let your back arch. Tense your thigh muscle as hard as you can without having more knee pain. Hold this  position for __________ seconds. Slowly lower your leg to the starting position. Allow your leg to relax all the way. Repeat __________ times. Complete this exercise __________ times a day. Hip hike  Stand sideways on a bottom step. Place your feet so that your left / right leg is on the step, and the other foot is hanging off the side. If you need support for balance, hold onto a railing or wall. Keep your knees straight and your abdomen square, meaning your hips are level. Then, lift your left / right hip up toward the ceiling. Slowly let your leg that's hanging off the step lower towards the floor. Your foot should get closer to the ground. Do not lean or bend your knees during this movement. Repeat __________ times. Complete this exercise __________ times a day. This information is not intended to replace advice given to you by your health care provider. Make sure you discuss any questions you have with your health care provider. Document Revised: 03/25/2022 Document Reviewed: 03/25/2022 Elsevier Patient Education  2024 [employer].

## 2023-05-19 NOTE — Progress Notes (Signed)
 CBC normal, CMP normal, sed rate normal, complements normal, urine protein creatinine ratio normal, ANA and double-stranded DNA pending.

## 2023-05-20 LAB — CBC WITH DIFFERENTIAL/PLATELET
Absolute Lymphocytes: 3203 {cells}/uL (ref 850–3900)
Absolute Monocytes: 816 {cells}/uL (ref 200–950)
Basophils Absolute: 112 {cells}/uL (ref 0–200)
Basophils Relative: 1.1 %
Eosinophils Absolute: 265 {cells}/uL (ref 15–500)
Eosinophils Relative: 2.6 %
HCT: 46.7 % (ref 38.5–50.0)
Hemoglobin: 15 g/dL (ref 13.2–17.1)
MCH: 27.5 pg (ref 27.0–33.0)
MCHC: 32.1 g/dL (ref 32.0–36.0)
MCV: 85.5 fL (ref 80.0–100.0)
MPV: 11 fL (ref 7.5–12.5)
Monocytes Relative: 8 %
Neutro Abs: 5804 {cells}/uL (ref 1500–7800)
Neutrophils Relative %: 56.9 %
Platelets: 214 10*3/uL (ref 140–400)
RBC: 5.46 10*6/uL (ref 4.20–5.80)
RDW: 13.4 % (ref 11.0–15.0)
Total Lymphocyte: 31.4 %
WBC: 10.2 10*3/uL (ref 3.8–10.8)

## 2023-05-20 LAB — SEDIMENTATION RATE: Sed Rate: 6 mm/h (ref 0–20)

## 2023-05-20 LAB — COMPREHENSIVE METABOLIC PANEL WITH GFR
AG Ratio: 2 (calc) (ref 1.0–2.5)
ALT: 21 U/L (ref 9–46)
AST: 19 U/L (ref 10–35)
Albumin: 4.3 g/dL (ref 3.6–5.1)
Alkaline phosphatase (APISO): 53 U/L (ref 35–144)
BUN: 21 mg/dL (ref 7–25)
CO2: 31 mmol/L (ref 20–32)
Calcium: 9.3 mg/dL (ref 8.6–10.3)
Chloride: 98 mmol/L (ref 98–110)
Creat: 0.9 mg/dL (ref 0.70–1.28)
Globulin: 2.1 g/dL (ref 1.9–3.7)
Glucose, Bld: 109 mg/dL — ABNORMAL HIGH (ref 65–99)
Potassium: 4 mmol/L (ref 3.5–5.3)
Sodium: 141 mmol/L (ref 135–146)
Total Bilirubin: 0.5 mg/dL (ref 0.2–1.2)
Total Protein: 6.4 g/dL (ref 6.1–8.1)
eGFR: 92 mL/min/{1.73_m2} (ref >=60)

## 2023-05-20 LAB — PROTEIN / CREATININE RATIO, URINE
Creatinine, Urine: 91 mg/dL (ref 20–320)
Protein/Creat Ratio: 110 mg/g{creat} (ref 25–148)
Protein/Creatinine Ratio: 0.11 mg/mg{creat} (ref 0.025–0.148)
Total Protein, Urine: 10 mg/dL (ref 5–25)

## 2023-05-20 LAB — ANA: Anti Nuclear Antibody (ANA): NEGATIVE

## 2023-05-20 LAB — ANTI-DNA ANTIBODY, DOUBLE-STRANDED: ds DNA Ab: 3 [IU]/mL

## 2023-05-20 LAB — C3 AND C4
C3 Complement: 141 mg/dL (ref 82–185)
C4 Complement: 22 mg/dL (ref 15–53)

## 2023-05-22 NOTE — Progress Notes (Signed)
 ANA negative, double-stranded DNA negative.  Labs do not indicate an active autoimmune disease.

## 2023-05-26 ENCOUNTER — Other Ambulatory Visit: Payer: Self-pay | Admitting: Physician Assistant

## 2023-05-26 NOTE — Telephone Encounter (Signed)
 Last Fill: 02/27/2023  Eye exam: 11/22/2022 WNL   Labs: 05/18/2022 CBC normal, CMP normal, sed rate normal, complements normal, urine protein creatinine ratio normal ANA negative, double-stranded DNA negative   Next Visit: 10/19/2023  Last Visit: 05/18/2023  DX: Autoimmune disease   Current Dose per office note 05/18/2023: I discussed decreasing the dose of hydroxychloroquine  to 200 mg p.o. twice daily Monday to Friday as his labs have been stable.   Okay to refill Plaquenil ?

## 2023-06-13 DIAGNOSIS — H40053 Ocular hypertension, bilateral: Secondary | ICD-10-CM | POA: Diagnosis not present

## 2023-06-13 DIAGNOSIS — H43811 Vitreous degeneration, right eye: Secondary | ICD-10-CM | POA: Diagnosis not present

## 2023-06-13 DIAGNOSIS — H2513 Age-related nuclear cataract, bilateral: Secondary | ICD-10-CM | POA: Diagnosis not present

## 2023-06-15 DIAGNOSIS — I10 Essential (primary) hypertension: Secondary | ICD-10-CM | POA: Diagnosis not present

## 2023-06-15 DIAGNOSIS — M329 Systemic lupus erythematosus, unspecified: Secondary | ICD-10-CM | POA: Diagnosis not present

## 2023-06-15 DIAGNOSIS — E1169 Type 2 diabetes mellitus with other specified complication: Secondary | ICD-10-CM | POA: Diagnosis not present

## 2023-06-15 DIAGNOSIS — R809 Proteinuria, unspecified: Secondary | ICD-10-CM | POA: Diagnosis not present

## 2023-06-15 DIAGNOSIS — E1142 Type 2 diabetes mellitus with diabetic polyneuropathy: Secondary | ICD-10-CM | POA: Diagnosis not present

## 2023-06-15 DIAGNOSIS — E1129 Type 2 diabetes mellitus with other diabetic kidney complication: Secondary | ICD-10-CM | POA: Diagnosis not present

## 2023-06-15 DIAGNOSIS — Z299 Encounter for prophylactic measures, unspecified: Secondary | ICD-10-CM | POA: Diagnosis not present

## 2023-08-04 DIAGNOSIS — G4733 Obstructive sleep apnea (adult) (pediatric): Secondary | ICD-10-CM | POA: Diagnosis not present

## 2023-08-23 DIAGNOSIS — Z79899 Other long term (current) drug therapy: Secondary | ICD-10-CM | POA: Diagnosis not present

## 2023-08-23 DIAGNOSIS — Z299 Encounter for prophylactic measures, unspecified: Secondary | ICD-10-CM | POA: Diagnosis not present

## 2023-08-23 DIAGNOSIS — I10 Essential (primary) hypertension: Secondary | ICD-10-CM | POA: Diagnosis not present

## 2023-08-23 DIAGNOSIS — E7800 Pure hypercholesterolemia, unspecified: Secondary | ICD-10-CM | POA: Diagnosis not present

## 2023-08-23 DIAGNOSIS — M329 Systemic lupus erythematosus, unspecified: Secondary | ICD-10-CM | POA: Diagnosis not present

## 2023-08-23 DIAGNOSIS — R5383 Other fatigue: Secondary | ICD-10-CM | POA: Diagnosis not present

## 2023-08-23 DIAGNOSIS — Z0000 Encounter for general adult medical examination without abnormal findings: Secondary | ICD-10-CM | POA: Diagnosis not present

## 2023-08-25 ENCOUNTER — Other Ambulatory Visit: Payer: Self-pay | Admitting: Rheumatology

## 2023-08-25 NOTE — Telephone Encounter (Signed)
 Last Fill: 05/26/2023  Eye exam: 11/22/2022 WNL   Labs: 05/18/2023 Patient advised CBC normal, CMP normal, sed rate normal, complements normal, urine protein creatinine ratio normal, ANA negative, double-stranded DNA negative.  Labs do not indicate an active autoimmune disease.   Next Visit: 10/26/2023  Last Visit: 05/18/2023  DX: Autoimmune disease   Current Dose per office note 05/18/2023: I discussed decreasing the dose of hydroxychloroquine  to 200 mg p.o. twice daily Monday to Friday as his labs have been stable.   Okay to refill Plaquenil ?

## 2023-08-29 ENCOUNTER — Telehealth: Payer: Self-pay

## 2023-08-29 NOTE — Telephone Encounter (Signed)
 Patient called the office to get a refill on his Plaquenil  advised it was sent yesterday. Also confirmed his next appointment

## 2023-09-21 DIAGNOSIS — I10 Essential (primary) hypertension: Secondary | ICD-10-CM | POA: Diagnosis not present

## 2023-09-21 DIAGNOSIS — R52 Pain, unspecified: Secondary | ICD-10-CM | POA: Diagnosis not present

## 2023-09-21 DIAGNOSIS — M2550 Pain in unspecified joint: Secondary | ICD-10-CM | POA: Diagnosis not present

## 2023-09-21 DIAGNOSIS — Z299 Encounter for prophylactic measures, unspecified: Secondary | ICD-10-CM | POA: Diagnosis not present

## 2023-09-21 DIAGNOSIS — E1142 Type 2 diabetes mellitus with diabetic polyneuropathy: Secondary | ICD-10-CM | POA: Diagnosis not present

## 2023-09-21 DIAGNOSIS — K047 Periapical abscess without sinus: Secondary | ICD-10-CM | POA: Diagnosis not present

## 2023-09-21 DIAGNOSIS — E119 Type 2 diabetes mellitus without complications: Secondary | ICD-10-CM | POA: Diagnosis not present

## 2023-10-12 NOTE — Progress Notes (Signed)
 Office Visit Note  Patient: Daryl Bell             Date of Birth: July 11, 1950           MRN: [id number]             PCP: Maree Isles, MD Referring: Maree Isles, MD Visit Date: 10/26/2023 Occupation: Data Unavailable  Subjective:  Medication monitoring   History of Present Illness: Daryl Bell is a 73 y.o. male with history of autoimmune disease.  Patient is taking plaquenil  200 mg 1 tablet by mouth twice daily Monday through Friday.  He is tolerating Plaquenil  without any side effects and has not had any gaps in therapy.  Patient denies any signs or symptoms of a flare recently.  Patient states that he has noticed an intermittent rash on his scalp and along the beard line which is typically exacerbated by shaving.  He denies any increased photosensitivity.  He denies any oral or nasal ulcerations.  He continues to have chronic sicca symptoms.  Patient states that he has an exacerbation of right sided hip pain consistent with bursitis.  He denies any pain at night.  He denies any joint swelling.  He denies any symptoms of Raynaud's phenomenon.    Activities of Daily Living:  Patient reports morning stiffness for 0 minute.   Patient Denies nocturnal pain.  Difficulty dressing/grooming: Denies Difficulty climbing stairs: Denies Difficulty getting out of chair: Denies Difficulty using hands for taps, buttons, cutlery, and/or writing: Denies  Review of Systems  Constitutional:  Positive for fatigue.  HENT:  Positive for mouth dryness. Negative for mouth sores.   Eyes:  Positive for dryness.  Respiratory:  Negative for shortness of breath.   Cardiovascular:  Positive for chest pain. Negative for palpitations.  Gastrointestinal:  Negative for blood in stool, constipation and diarrhea.  Endocrine: Negative for increased urination.  Genitourinary:  Negative for involuntary urination.  Musculoskeletal:  Positive for joint pain and joint pain. Negative for gait problem,  joint swelling, myalgias, muscle weakness, morning stiffness, muscle tenderness and myalgias.  Skin:  Positive for rash and sensitivity to sunlight. Negative for color change and hair loss.  Allergic/Immunologic: Negative for susceptible to infections.  Neurological:  Positive for headaches. Negative for dizziness.  Hematological:  Negative for swollen glands.  Psychiatric/Behavioral:  Negative for depressed mood and sleep disturbance. The patient is not nervous/anxious.     PMFS History:  Patient Active Problem List   Diagnosis Date Noted   Neck pain 03/31/2016   Acute pain of left shoulder 03/31/2016   ANA positive 12/29/2015   Ds DNA antibody positive 12/29/2015   Photosensitivity 12/29/2015   High risk medication use 12/29/2015   Other dysphagia 02/13/2013   Encounter for screening colonoscopy 02/13/2013    Past Medical History:  Diagnosis Date   Arthritis    Diabetes (HCC)    GERD (gastroesophageal reflux disease)    Hypercholesterolemia    Hypertension    Kidney calculus     Family History  Problem Relation Age of Onset   Dementia Father    Colon cancer Neg Hx    Past Surgical History:  Procedure Laterality Date   COLONOSCOPY, ESOPHAGOGASTRODUODENOSCOPY (EGD) AND ESOPHAGEAL DILATION N/A 03/07/2013   Procedure: COLONOSCOPY, ESOPHAGOGASTRODUODENOSCOPY (EGD) AND ESOPHAGEAL DILATION;  Surgeon: Lamar CHRISTELLA Hollingshead, MD;  Location: [facility];  Service: Endoscopy;  Laterality: N/A;  11:15   ESOPHAGOGASTRODUODENOSCOPY     remote past in the 1990s with dilation   KIDNEY  STONE SURGERY  2011   Social History   Tobacco Use   Smoking status: Never    Passive exposure: Past   Smokeless tobacco: Never  Vaping Use   Vaping status: Former  Substance Use Topics   Alcohol  use: Yes    Comment: Rarely   Drug use: Not Currently    Types: Marijuana   Social History   Social History Narrative   Not on file     Immunization History  Administered Date(s) Administered    INFLUENZA, HIGH DOSE SEASONAL PF 10/25/2017, 11/15/2018   Moderna Sars-Covid-2 Vaccination 03/21/2019, 04/18/2019, 12/24/2019   Pneumococcal Polysaccharide-23 11/15/2018   Zoster Recombinant(Shingrix) 06/25/2017, 10/25/2017     Objective: Vital Signs: BP 123/78   Pulse 87   Temp 97.7 F (36.5 C)   Resp 14   Ht 5' 7 (1.702 m)   Wt 201 lb (91.2 kg)   BMI 31.48 kg/m    Physical Exam Vitals and nursing note reviewed.  Constitutional:      Appearance: He is well-developed.  HENT:     Head: Normocephalic and atraumatic.  Eyes:     Conjunctiva/sclera: Conjunctivae normal.     Pupils: Pupils are equal, round, and reactive to light.  Cardiovascular:     Rate and Rhythm: Normal rate and regular rhythm.     Heart sounds: Normal heart sounds.  Pulmonary:     Effort: Pulmonary effort is normal.     Breath sounds: Normal breath sounds.  Abdominal:     General: Bowel sounds are normal.     Palpations: Abdomen is soft.  Musculoskeletal:     Cervical back: Normal range of motion and neck supple.  Skin:    General: Skin is warm and dry.     Capillary Refill: Capillary refill takes less than 2 seconds.     Comments: Patches of psoriasis scattered on scalp and along beard-line.   Neurological:     Mental Status: He is alert and oriented to person, place, and time.  Psychiatric:        Behavior: Behavior normal.      Musculoskeletal Exam: C-spine, thoracic spine, lumbar spine have good range of motion.  No midline spinal tenderness.  No SI joint tenderness.  Shoulder joints, elbow joints, wrist joints, MCPs, PIPs, DIPs have good range of motion with no synovitis.  Left CMC joint prominence and thickening.  PIP and DIP thickening consistent with osteoarthritis of both hands.  Complete fist formation bilaterally.  Hip joints have good range of motion with no groin pain.  Knee joints have good range of motion no warmth or effusion.  Ankle joints have good range of motion no tenderness or  joint swelling.  No evidence of Achilles tendinitis or plantar fasciitis. Tenderness over the right trochanteric bursa.   CDAI Exam: CDAI Score: -- Patient Global: --; Provider Global: -- Swollen: --; Tender: -- Joint Exam 10/26/2023   No joint exam has been documented for this visit   There is currently no information documented on the homunculus. Go to the Rheumatology activity and complete the homunculus joint exam.  Investigation: No additional findings.  Imaging: No results found.  Recent Labs: Lab Results  Component Value Date   WBC 10.2 05/18/2023   HGB 15.0 05/18/2023   PLT 214 05/18/2023   NA 141 05/18/2023   K 4.0 05/18/2023   CL 98 05/18/2023   CO2 31 05/18/2023   GLUCOSE 109 (H) 05/18/2023   BUN 21 05/18/2023   CREATININE 0.90 05/18/2023  BILITOT 0.5 05/18/2023   ALKPHOS 46 09/01/2016   AST 19 05/18/2023   ALT 21 05/18/2023   PROT 6.4 05/18/2023   ALBUMIN 4.2 09/01/2016   CALCIUM 9.3 05/18/2023   GFRAA 93 04/16/2020    Speciality Comments: [facility] Eye Exam 11/22/2022 WNL Dr. Willma Moats, [place] f/u 1 year  Procedures:  No procedures performed Allergies: Bee pollen   Assessment / Plan:     Visit Diagnoses: Autoimmune disease - +ANA,+dsDNA, photosensitivity, sicca symptoms: He has not had any signs or symptoms of a flare.  He has clinically been doing well taking Plaquenil  200 mg 1 tablet by mouth twice daily Monday through Friday.  He is tolerating Plaquenil  without any side effects and has not had any gaps in therapy.  No oral or nasal ulcerations.  No symptoms of Raynaud's melanotic.  No increase photosensitivity.  No synovitis.  He continues to have chronic sicca symptoms.  He presents today with a rash on his face and scalp consistent with his psoriasis.  Discussed that occasionally Plaquenil  can worsen psoriasis.  Since his lab work and symptoms have been consistent with low disease activity we discussed decreasing the dose of Plaquenil  further to 200  mg once daily.  He voiced understanding.  A new prescription and a refill be sent to the pharmacy.  For management of psoriasis a prescription for hydrocortisone 2.5% cream will be sent to the pharmacy to apply on the face as needed and a prescription for clobetasol 0.05% solution will be sent to the pharmacy to use on the scalp as needed.  He will notify us  if the symptoms persist or worsen or if he develops any other rashes. Lab work from 05/18/23 was reviewed today in the office: ANA negative, dsDNA negative, complements WNL, ESR WNL, and urine protein creatinine ratio WNL.  The following lab work will be updated today.  He was advised to notify us  if he develops any new or worsening symptoms.  He will follow-up in the office in 5 months or sooner if needed.  High risk medication use - Plaquenil  200 mg 1 tablet by mouth daily. PLQ Eye Exam November 22, 2022. [facility] Eye Exam 11/22/2022 WNL Dr. Willma Moats, [place] f/u 1 year  CBC and CMP updated on 05/18/23.   Psoriasis: Patient has scattered patches of psoriasis on his scalp and along the beard line.  Prescription for clobetasol solution will be sent to the pharmacy to use on the scalp and hydrocortisone 2.5% cream will be sent to use on the face as needed.  Discussed the importance of not exceeding 14 consecutive days of use due to the increased risk for thinning of the skin and pigmentation changes with topical steroids.  Plan to also reduce Plaquenil  to once daily dosing--discussed that occasionally Plaquenil  can worsen psoriasis.  Chronic left shoulder pain: Not currently symptomatic.  Good range of motion with no discomfort.  Primary osteoarthritis of both hands: He has PIP and DIP thickening consistent with osteoarthritis of both hands.  No synovitis noted.  Complete fist formation bilaterally.  Arthritis of carpometacarpal (CMC) joint of left thumb: Prominence and thickening.  Uses a brace as needed.  Trochanteric bursitis of both hips: Patient  presents today with an exacerbation of pain over the right trochanteric bursa.  Patient was given a handout of exercises to perform.  If his symptoms persist or worsen he can return for a cortisone injection in the future if needed.  Other medical conditions are listed as follows:  History of  diabetes mellitus  History of hypercholesterolemia  History of hypertension: Blood pressure was 123/78 today in the office.  Renal calcinosis  History of gastroesophageal reflux (GERD)  History of sleep apnea  Vitamin D  deficiency  Lateral epicondylitis, left elbow: Not currently symptomatic.  Orders: Orders Placed This Encounter  Procedures   Protein / creatinine ratio, urine   CBC with Differential/Platelet   Anti-DNA antibody, double-stranded   C3 and C4   Sedimentation rate   Comprehensive metabolic panel with GFR   Meds ordered this encounter  Medications   clobetasol (TEMOVATE) 0.05 % external solution    Sig: Apply 1 Application topically 2 (two) times daily. For up to 14 days.    Dispense:  50 mL    Refill:  0   hydrocortisone 2.5 % cream    Sig: Apply topically 2 (two) times daily. For up to 14 days.    Dispense:  30 g    Refill:  0   hydroxychloroquine  (PLAQUENIL ) 200 MG tablet    Sig: TAKE 1 TABLET BY MOUTH DAILY.    Dispense:  90 tablet    Refill:  0     Follow-Up Instructions: Return in about 5 months (around 03/25/2024) for Autoimmune Disease.   Waddell CHRISTELLA Craze, PA-C  Note - This record has been created using Dragon software.  Chart creation errors have been sought, but may not always  have been located. Such creation errors do not reflect on  the standard of medical care.

## 2023-10-19 ENCOUNTER — Ambulatory Visit: Admitting: Physician Assistant

## 2023-10-26 ENCOUNTER — Ambulatory Visit: Attending: Physician Assistant | Admitting: Physician Assistant

## 2023-10-26 ENCOUNTER — Encounter: Payer: Self-pay | Admitting: Physician Assistant

## 2023-10-26 ENCOUNTER — Other Ambulatory Visit: Payer: Self-pay | Admitting: Physician Assistant

## 2023-10-26 VITALS — BP 123/78 | HR 87 | Temp 97.7°F | Resp 14 | Ht 67.0 in | Wt 201.0 lb

## 2023-10-26 DIAGNOSIS — M359 Systemic involvement of connective tissue, unspecified: Secondary | ICD-10-CM | POA: Diagnosis not present

## 2023-10-26 DIAGNOSIS — M1812 Unilateral primary osteoarthritis of first carpometacarpal joint, left hand: Secondary | ICD-10-CM | POA: Diagnosis not present

## 2023-10-26 DIAGNOSIS — L409 Psoriasis, unspecified: Secondary | ICD-10-CM

## 2023-10-26 DIAGNOSIS — Z79899 Other long term (current) drug therapy: Secondary | ICD-10-CM | POA: Diagnosis not present

## 2023-10-26 DIAGNOSIS — Z8719 Personal history of other diseases of the digestive system: Secondary | ICD-10-CM | POA: Diagnosis not present

## 2023-10-26 DIAGNOSIS — N29 Other disorders of kidney and ureter in diseases classified elsewhere: Secondary | ICD-10-CM

## 2023-10-26 DIAGNOSIS — Z8679 Personal history of other diseases of the circulatory system: Secondary | ICD-10-CM | POA: Diagnosis not present

## 2023-10-26 DIAGNOSIS — M7061 Trochanteric bursitis, right hip: Secondary | ICD-10-CM | POA: Diagnosis not present

## 2023-10-26 DIAGNOSIS — M25512 Pain in left shoulder: Secondary | ICD-10-CM

## 2023-10-26 DIAGNOSIS — M7062 Trochanteric bursitis, left hip: Secondary | ICD-10-CM

## 2023-10-26 DIAGNOSIS — M7712 Lateral epicondylitis, left elbow: Secondary | ICD-10-CM

## 2023-10-26 DIAGNOSIS — M19041 Primary osteoarthritis, right hand: Secondary | ICD-10-CM

## 2023-10-26 DIAGNOSIS — Z8639 Personal history of other endocrine, nutritional and metabolic disease: Secondary | ICD-10-CM

## 2023-10-26 DIAGNOSIS — Z8669 Personal history of other diseases of the nervous system and sense organs: Secondary | ICD-10-CM

## 2023-10-26 DIAGNOSIS — E559 Vitamin D deficiency, unspecified: Secondary | ICD-10-CM

## 2023-10-26 DIAGNOSIS — M19042 Primary osteoarthritis, left hand: Secondary | ICD-10-CM

## 2023-10-26 DIAGNOSIS — G8929 Other chronic pain: Secondary | ICD-10-CM

## 2023-10-26 MED ORDER — HYDROXYCHLOROQUINE SULFATE 200 MG PO TABS: ORAL_TABLET | ORAL | 0 refills | Status: DC

## 2023-10-26 MED ORDER — CLOBETASOL PROPIONATE 0.05 % EX SOLN: 1.0000 | Freq: Two times a day (BID) | CUTANEOUS | 0 refills | Status: AC

## 2023-10-26 MED ORDER — HYDROCORTISONE 2.5 % EX CREA: TOPICAL_CREAM | Freq: Two times a day (BID) | CUTANEOUS | 0 refills | Status: AC

## 2023-10-26 NOTE — Patient Instructions (Signed)
 Hip Bursitis Rehab Ask your health care provider which exercises are safe for you. Do exercises exactly as told by your health care provider and adjust them as directed. It is normal to feel mild stretching, pulling, tightness, or discomfort as you do these exercises. Stop right away if you feel sudden pain or your pain gets worse. Do not begin these exercises until told by your health care provider. Stretching exercise This exercise warms up your muscles and joints and improves the movement and flexibility of your hip. This exercise also helps to relieve pain and stiffness. Iliotibial band stretch An iliotibial band is a strong band of muscle tissue that runs from the outer side of your hip to the outer side of your thigh and knee. Lie on your side with your left / right leg in the top position. Bend your left / right knee and grab your ankle. Stretch out your bottom arm to help you balance. Slowly bring your knee back so your thigh is slightly behind your body. Slowly lower your knee toward the floor until you feel a gentle stretch on the outside of your left / right thigh. If you do not feel a stretch and your knee will not lower more toward the floor, place the heel of your other foot on top of your knee and pull your knee down toward the floor with your foot. Hold this position for __________ seconds. Slowly return to the starting position. Repeat __________ times. Complete this exercise __________ times a day. Strengthening exercises These exercises build strength and endurance in your hip and pelvis. Endurance is the ability to use your muscles for a long time, even after they get tired. Bridge This exercise strengthens the muscles that move your thigh backward (hip extensors). Lie on your back on a firm surface with your knees bent and your feet flat on the floor. Tighten your buttocks muscles and lift your buttocks off the floor until your trunk is level with your thighs. Do not arch your  back. You should feel the muscles working in your buttocks and the back of your thighs. If you do not feel these muscles, slide your feet 1-2 inches (2.5-5 cm) farther away from your buttocks. If this exercise is too easy, try doing it with your arms crossed over your chest. Hold this position for __________ seconds. Slowly lower your hips to the starting position. Let your muscles relax completely after each repetition. Repeat __________ times. Complete this exercise __________ times a day. Squats This exercise strengthens the muscles in front of your thigh and knee (quadriceps). Stand in front of a table, with your feet and knees pointing straight ahead. You may rest your hands on the table for balance but not for support. Slowly bend your knees and lower your hips like you are going to sit in a chair. Keep your weight over your heels, not over your toes. Keep your lower legs upright so they are parallel with the table legs. Do not let your hips go lower than your knees. Do not bend lower than told by your health care provider. If your hip pain increases, do not bend as low. Hold the squat position for __________ seconds. Slowly push with your legs to return to standing. Do not use your hands to pull yourself to standing. Repeat __________ times. Complete this exercise __________ times a day. Hip hike  Stand sideways on a bottom step. Stand on your left / right leg with your other foot unsupported next to  the step. You can hold on to the railing or wall for balance if needed. Keep your knees straight and your torso square. Then lift your left / right hip up toward the ceiling. Hold this position for __________ seconds. Slowly let your left / right hip lower toward the floor, past the starting position. Your foot should get closer to the floor. Do not lean or bend your knees. Repeat __________ times. Complete this exercise __________ times a day. Single leg stand This exercise increases  your balance. Without shoes, stand near a railing or in a doorway. You may hold on to the railing or door frame as needed for balance. Squeeze your left / right buttock muscles, then lift up your other foot. Do not let your left / right hip push out to the side. It is helpful to stand in front of a mirror for this exercise so you can watch your hip. Hold this position for __________ seconds. Repeat __________ times. Complete this exercise __________ times a day. This information is not intended to replace advice given to you by your health care provider. Make sure you discuss any questions you have with your health care provider. Document Revised: 12/23/2020 Document Reviewed: 12/23/2020 Elsevier Patient Education  2024 [employer].

## 2023-10-27 ENCOUNTER — Ambulatory Visit: Payer: Self-pay | Admitting: Physician Assistant

## 2023-10-27 LAB — CBC WITH DIFFERENTIAL/PLATELET
Absolute Lymphocytes: 3379 {cells}/uL (ref 850–3900)
Absolute Monocytes: 710 {cells}/uL (ref 200–950)
Basophils Absolute: 96 {cells}/uL (ref 0–200)
Basophils Relative: 1 %
Eosinophils Absolute: 202 {cells}/uL (ref 15–500)
Eosinophils Relative: 2.1 %
HCT: 46.9 % (ref 38.5–50.0)
Hemoglobin: 15.3 g/dL (ref 13.2–17.1)
MCH: 29.1 pg (ref 27.0–33.0)
MCHC: 32.6 g/dL (ref 32.0–36.0)
MCV: 89.3 fL (ref 80.0–100.0)
MPV: 11.6 fL (ref 7.5–12.5)
Monocytes Relative: 7.4 %
Neutro Abs: 5213 {cells}/uL (ref 1500–7800)
Neutrophils Relative %: 54.3 %
Platelets: 229 Thousand/uL (ref 140–400)
RBC: 5.25 Million/uL (ref 4.20–5.80)
RDW: 12.7 % (ref 11.0–15.0)
Total Lymphocyte: 35.2 %
WBC: 9.6 Thousand/uL (ref 3.8–10.8)

## 2023-10-27 LAB — COMPREHENSIVE METABOLIC PANEL WITH GFR
AG Ratio: 2.3 (calc) (ref 1.0–2.5)
ALT: 17 U/L (ref 9–46)
AST: 16 U/L (ref 10–35)
Albumin: 4.4 g/dL (ref 3.6–5.1)
Alkaline phosphatase (APISO): 49 U/L (ref 35–144)
BUN: 22 mg/dL (ref 7–25)
CO2: 30 mmol/L (ref 20–32)
Calcium: 9.2 mg/dL (ref 8.6–10.3)
Chloride: 99 mmol/L (ref 98–110)
Creat: 0.89 mg/dL (ref 0.70–1.28)
Globulin: 1.9 g/dL (ref 1.9–3.7)
Glucose, Bld: 114 mg/dL (ref 65–139)
Potassium: 3.7 mmol/L (ref 3.5–5.3)
Sodium: 140 mmol/L (ref 135–146)
Total Bilirubin: 0.5 mg/dL (ref 0.2–1.2)
Total Protein: 6.3 g/dL (ref 6.1–8.1)
eGFR: 92 mL/min/1.73m2 (ref >=60)

## 2023-10-27 LAB — PROTEIN / CREATININE RATIO, URINE
Creatinine, Urine: 59 mg/dL (ref 20–320)
Protein/Creat Ratio: 119 mg/g{creat} (ref 25–148)
Protein/Creatinine Ratio: 0.119 mg/mg{creat} (ref 0.025–0.148)
Total Protein, Urine: 7 mg/dL (ref 5–25)

## 2023-10-27 LAB — ANTI-DNA ANTIBODY, DOUBLE-STRANDED: ds DNA Ab: 3 [IU]/mL

## 2023-10-27 LAB — SEDIMENTATION RATE: Sed Rate: 2 mm/h (ref 0–20)

## 2023-10-27 LAB — C3 AND C4
C3 Complement: 142 mg/dL (ref 82–185)
C4 Complement: 22 mg/dL (ref 15–53)

## 2023-10-27 NOTE — Progress Notes (Signed)
 CBC and CMP WNL ESR WNL Urine protein creatinine ratio WNL

## 2023-10-30 NOTE — Progress Notes (Signed)
dsDNA is negative.  Labs are not consistent with a flare.

## 2024-01-21 ENCOUNTER — Other Ambulatory Visit: Payer: Self-pay | Admitting: Physician Assistant

## 2024-01-22 NOTE — Telephone Encounter (Signed)
 Last Fill: 10/26/2023  Eye exam: 11/22/2022    Labs: 10/26/2023 CBC and CMP WNL ESR WNL Urine protein creatinine ratio WNL  Next Visit: 03/28/2024  Last Visit: 10/26/2023  IK:Jlunpfflwz disease   Current Dose per office note on 10/26/2023: Plaquenil  200 mg 1 tablet by mouth daily.   Attempted to contact patient regarding PLQ eye exam and unable to leave message, mailbox is full.   Okay to refill Plaquenil ?

## 2024-02-22 ENCOUNTER — Other Ambulatory Visit: Payer: Self-pay | Admitting: Physician Assistant

## 2024-02-22 NOTE — Telephone Encounter (Signed)
 Last Fill: 01/22/2024 (30 day supply)   Eye exam: 11/22/2022  WNL   Labs: 10/26/2023 CBC and CMP WNL ESR WNL Urine protein creatinine ratio WNL   Next Visit: 03/28/2024   Last Visit: 10/26/2023   IK:Jlunpfflwz disease    Current Dose per office note on 10/26/2023: Plaquenil  200 mg 1 tablet by mouth daily.    PLQ Eye Exam scheduled for 02/29/2024   Okay to refill Plaquenil ?

## 2024-03-28 ENCOUNTER — Ambulatory Visit: Admitting: Physician Assistant
